# Patient Record
Sex: Male | Born: 1970 | Race: White | Hispanic: No | Marital: Married | Smoking: Never smoker
Health system: Southern US, Community
[De-identification: ages and names within clinical notes are randomized; demographics above are authoritative.]

## PROBLEM LIST (undated history)

## (undated) DIAGNOSIS — E079 Disorder of thyroid, unspecified: Secondary | ICD-10-CM

## (undated) DIAGNOSIS — F329 Major depressive disorder, single episode, unspecified: Secondary | ICD-10-CM

## (undated) DIAGNOSIS — E236 Other disorders of pituitary gland: Secondary | ICD-10-CM

## (undated) DIAGNOSIS — R4189 Other symptoms and signs involving cognitive functions and awareness: Secondary | ICD-10-CM

## (undated) DIAGNOSIS — R030 Elevated blood-pressure reading, without diagnosis of hypertension: Secondary | ICD-10-CM

## (undated) DIAGNOSIS — I10 Essential (primary) hypertension: Secondary | ICD-10-CM

## (undated) DIAGNOSIS — J45909 Unspecified asthma, uncomplicated: Secondary | ICD-10-CM

## (undated) DIAGNOSIS — G4733 Obstructive sleep apnea (adult) (pediatric): Secondary | ICD-10-CM

## (undated) DIAGNOSIS — K2210 Ulcer of esophagus without bleeding: Secondary | ICD-10-CM

## (undated) DIAGNOSIS — F411 Generalized anxiety disorder: Secondary | ICD-10-CM

## (undated) DIAGNOSIS — M6281 Muscle weakness (generalized): Secondary | ICD-10-CM

## (undated) DIAGNOSIS — I309 Acute pericarditis, unspecified: Secondary | ICD-10-CM

## (undated) DIAGNOSIS — H9319 Tinnitus, unspecified ear: Secondary | ICD-10-CM

## (undated) HISTORY — DX: Muscle weakness (generalized): M62.81

## (undated) HISTORY — PX: URETEROSCOPY: SHX842

## (undated) HISTORY — PX: COLONOSCOPY: SHX174

## (undated) HISTORY — DX: Other symptoms and signs involving cognitive functions and awareness: R41.89

## (undated) HISTORY — DX: Tinnitus, unspecified ear: H93.19

## (undated) HISTORY — DX: Disorder of thyroid, unspecified: E07.9

## (undated) HISTORY — DX: Acute pericarditis, unspecified: I30.9

## (undated) HISTORY — DX: Generalized anxiety disorder: F41.1

## (undated) HISTORY — DX: Unspecified asthma, uncomplicated: J45.909

## (undated) HISTORY — DX: Obstructive sleep apnea (adult) (pediatric): G47.33

## (undated) HISTORY — PX: NO PAST SURGERIES: SHX2092

## (undated) HISTORY — DX: Major depressive disorder, single episode, unspecified: F32.9

## (undated) HISTORY — DX: Essential (primary) hypertension: I10

---

## 2003-07-10 ENCOUNTER — Emergency Department (HOSPITAL_COMMUNITY): Admission: EM | Admit: 2003-07-10 | Discharge: 2003-07-10 | Payer: Self-pay | Admitting: Emergency Medicine

## 2009-07-26 ENCOUNTER — Encounter: Admission: RE | Admit: 2009-07-26 | Discharge: 2009-07-26 | Payer: Self-pay | Admitting: Family Medicine

## 2013-09-12 ENCOUNTER — Ambulatory Visit (INDEPENDENT_AMBULATORY_CARE_PROVIDER_SITE_OTHER): Payer: 59 | Admitting: Emergency Medicine

## 2013-09-12 VITALS — BP 140/100 | HR 90 | Temp 98.7°F | Resp 18 | Ht 69.8 in | Wt 198.8 lb

## 2013-09-12 DIAGNOSIS — J111 Influenza due to unidentified influenza virus with other respiratory manifestations: Secondary | ICD-10-CM

## 2013-09-12 MED ORDER — OSELTAMIVIR PHOSPHATE 75 MG PO CAPS
75.0000 mg | ORAL_CAPSULE | Freq: Two times a day (BID) | ORAL | Status: DC
Start: 2013-09-12 — End: 2015-04-22

## 2013-09-12 MED ORDER — PSEUDOEPHEDRINE-GUAIFENESIN ER 60-600 MG PO TB12: 1.0000 | ORAL_TABLET | Freq: Two times a day (BID) | ORAL | Status: AC

## 2013-09-12 MED ORDER — PROMETHAZINE-CODEINE 6.25-10 MG/5ML PO SYRP: 5.0000 mL | ORAL_SOLUTION | Freq: Four times a day (QID) | ORAL | Status: DC | PRN

## 2013-09-12 NOTE — Patient Instructions (Signed)

## 2013-09-12 NOTE — Progress Notes (Signed)
[facility] [address], [place] [id number] [phone]  Date:  09/12/2013   Name:  Michael Warren   DOB:  [birth year]   MRN:  [id number]  PCP:  No primary provider on file.    Chief Complaint: scratchy, Headache, Shortness of Breath, chest congestion and Generalized Body Aches   History of Present Illness:  Michael Warren is a 43 y.o. very pleasant male patient who presents with the following:  Ill since yesterday with malaise, fatigue and myalgias.  Has nasal congestion and drainage.  Cough that is non productive.  No fever or chills.  No nausea or vomiting.   No stool change or rash.  No improvement with over the counter medications or other home remedies. Denies other complaint or health concern today. Has several ill co workers.  No flu shot.  There are no active problems to display for this patient.   Past Medical History  Diagnosis Date  . Thyroid disease     History reviewed. No pertinent past surgical history.  History  Substance Use Topics  . Smoking status: Never Smoker   . Smokeless tobacco: Not on file  . Alcohol Use: No    History reviewed. No pertinent family history.  No Known Allergies  Medication list has been reviewed and updated.  No current outpatient prescriptions on file prior to visit.   No current facility-administered medications on file prior to visit.    Review of Systems:  As per HPI, otherwise negative.    Physical Examination: Filed Vitals:   09/12/13 1326  BP: 140/100  Pulse: 90  Temp: 98.7 F (37.1 C)  Resp: 18   Filed Vitals:   09/12/13 1326  Height: 5' 9.8" (1.773 m)  Weight: 198 lb 12.8 oz (90.175 kg)   Body mass index is 28.69 kg/(m^2). Ideal Body Weight: Weight in (lb) to have BMI = 25: 172.9  GEN: WDWN, NAD, Non-toxic, A & O x 3 HEENT: Atraumatic, Normocephalic. Neck supple. No masses, No LAD. Ears and Nose: No external deformity. CV: RRR, No M/G/R. No JVD. No thrill. No extra heart  sounds. PULM: CTA B, no wheezes, crackles, rhonchi. No retractions. No resp. distress. No accessory muscle use. ABD: S, NT, ND, +BS. No rebound. No HSM. EXTR: No c/c/e NEURO Normal gait.  PSYCH: Normally interactive. Conversant. Not depressed or anxious appearing.  Calm demeanor.    Assessment and Plan: Influenza tamiflu mucinex d Phen c cod  Signed,  Phillips OdorJeffery Spencer Cardinal, MD

## 2014-01-28 ENCOUNTER — Other Ambulatory Visit: Payer: Self-pay | Admitting: Internal Medicine

## 2014-01-28 DIAGNOSIS — E236 Other disorders of pituitary gland: Secondary | ICD-10-CM

## 2014-01-28 DIAGNOSIS — Z808 Family history of malignant neoplasm of other organs or systems: Secondary | ICD-10-CM

## 2014-02-06 ENCOUNTER — Ambulatory Visit
Admission: RE | Admit: 2014-02-06 | Discharge: 2014-02-06 | Disposition: A | Discharge status: Home / Self Care | Payer: 59 | Source: Ambulatory Visit | Attending: Internal Medicine | Admitting: Internal Medicine

## 2014-02-06 DIAGNOSIS — E236 Other disorders of pituitary gland: Secondary | ICD-10-CM

## 2014-02-06 DIAGNOSIS — Z808 Family history of malignant neoplasm of other organs or systems: Secondary | ICD-10-CM

## 2014-02-06 MED ORDER — GADOBENATE DIMEGLUMINE 529 MG/ML IV SOLN
13.0000 mL | Freq: Once | INTRAVENOUS | Status: AC | PRN
  Administered 2014-02-06: 13 mL via INTRAVENOUS

## 2015-04-22 ENCOUNTER — Encounter (HOSPITAL_COMMUNITY): Payer: Self-pay | Admitting: Emergency Medicine

## 2015-04-22 ENCOUNTER — Emergency Department (HOSPITAL_COMMUNITY)
Admission: EM | Admit: 2015-04-22 | Discharge: 2015-04-22 | Disposition: A | Discharge status: Home / Self Care | Payer: 59 | Attending: Emergency Medicine | Admitting: Emergency Medicine

## 2015-04-22 DIAGNOSIS — Z79899 Other long term (current) drug therapy: Secondary | ICD-10-CM | POA: Insufficient documentation

## 2015-04-22 DIAGNOSIS — Z7982 Long term (current) use of aspirin: Secondary | ICD-10-CM | POA: Diagnosis not present

## 2015-04-22 DIAGNOSIS — K219 Gastro-esophageal reflux disease without esophagitis: Secondary | ICD-10-CM | POA: Diagnosis not present

## 2015-04-22 DIAGNOSIS — E079 Disorder of thyroid, unspecified: Secondary | ICD-10-CM | POA: Diagnosis not present

## 2015-04-22 DIAGNOSIS — R1310 Dysphagia, unspecified: Secondary | ICD-10-CM | POA: Diagnosis present

## 2015-04-22 DIAGNOSIS — K2970 Gastritis, unspecified, without bleeding: Secondary | ICD-10-CM | POA: Diagnosis not present

## 2015-04-22 LAB — COMPREHENSIVE METABOLIC PANEL
ALT: 18 U/L (ref 17–63)
AST: 20 U/L (ref 15–41)
Albumin: 4.2 g/dL (ref 3.5–5.0)
Alkaline Phosphatase: 57 U/L (ref 38–126)
Anion gap: 9 (ref 5–15)
BUN: 19 mg/dL (ref 6–20)
CO2: 27 mmol/L (ref 22–32)
Calcium: 9 mg/dL (ref 8.9–10.3)
Chloride: 104 mmol/L (ref 101–111)
Creatinine, Ser: 1.17 mg/dL (ref 0.61–1.24)
GFR calc Af Amer: >60 mL/min (ref >60)
GFR calc non Af Amer: >60 mL/min (ref >60)
Glucose, Bld: 88 mg/dL (ref 65–99)
Potassium: 3.8 mmol/L (ref 3.5–5.1)
Sodium: 140 mmol/L (ref 135–145)
Total Bilirubin: 0.9 mg/dL (ref 0.3–1.2)
Total Protein: 7.2 g/dL (ref 6.5–8.1)

## 2015-04-22 LAB — CBC
HCT: 49 % (ref 39.0–52.0)
Hemoglobin: 16.9 g/dL (ref 13.0–17.0)
MCH: 30.7 pg (ref 26.0–34.0)
MCHC: 34.5 g/dL (ref 30.0–36.0)
MCV: 89.1 fL (ref 78.0–100.0)
Platelets: 197 10*3/uL (ref 150–400)
RBC: 5.5 MIL/uL (ref 4.22–5.81)
RDW: 11.9 % (ref 11.5–15.5)
WBC: 8.7 10*3/uL (ref 4.0–10.5)

## 2015-04-22 LAB — LIPASE, BLOOD: Lipase: 27 U/L (ref 22–51)

## 2015-04-22 LAB — I-STAT TROPONIN, ED: Troponin i, poc: 0.01 ng/mL (ref 0.00–0.08)

## 2015-04-22 MED ORDER — PANTOPRAZOLE SODIUM 40 MG PO TBEC: 40.0000 mg | DELAYED_RELEASE_TABLET | Freq: Every day | ORAL | Status: DC

## 2015-04-22 MED ORDER — GI COCKTAIL ~~LOC~~
30.0000 mL | Freq: Once | ORAL | Status: AC
  Administered 2015-04-22: 30 mL via ORAL
  Filled 2015-04-22: qty 30

## 2015-04-22 MED ORDER — ACETAMINOPHEN 500 MG PO TABS
1000.0000 mg | ORAL_TABLET | Freq: Once | ORAL | Status: AC
  Administered 2015-04-22: 1000 mg via ORAL
  Filled 2015-04-22: qty 2

## 2015-04-22 MED ORDER — SUCRALFATE 1 G PO TABS: 1.0000 g | ORAL_TABLET | Freq: Three times a day (TID) | ORAL | Status: DC

## 2015-04-22 MED ORDER — PANTOPRAZOLE SODIUM 40 MG PO TBEC
40.0000 mg | DELAYED_RELEASE_TABLET | Freq: Once | ORAL | Status: AC
  Administered 2015-04-22: 40 mg via ORAL
  Filled 2015-04-22: qty 1

## 2015-04-22 NOTE — ED Provider Notes (Signed)
CSN: [id number]     Arrival date & time 04/22/15  1349 History   First MD Initiated Contact with Patient 04/22/15 1543     Chief Complaint  Patient presents with  . Dysphagia    esophagus pain with swallowing     (Consider location/radiation/quality/duration/timing/severity/associated sxs/prior Treatment) HPI   Patient is a 44 year old male with history of thyroid disease, who presents to the emergency room with approximately 1 week of epigastric and central chest pain associated with painful swallowing of liquids and solids. He states he has a history of GERD and was recently instructed to increased his Prilosec to 40 mg twice a day, this has not improved his pain. He states that he has severe pain with eating, described similar to "swallowing a fish bone", and he has a lingering pain in his epigastric and central chest area that is described as achy, rated 2 out of 10.  He states he physically can swallow both liquids and solids without any sensation of his food being stuck and without vomiting. He states he has had postprandial fullness for several weeks and generalized bloating. He takes either ibuprofen or aspirin several times a week for headaches and drinks a beer most days of the week.  He denies hematemesis, CP, lightheadedness, palpitations, LE edema, SOB, fever, chills, sweats, diarrhea, constipation, melena, hematochezia.  He does have an upcoming appointment with GI for an initial evaluation.  Past Medical History  Diagnosis Date  . Thyroid disease    History reviewed. No pertinent past surgical history. History reviewed. No pertinent family history. Social History  Substance Use Topics  . Smoking status: Never Smoker   . Smokeless tobacco: None  . Alcohol Use: No    Review of Systems  Constitutional: Negative.   HENT: Negative.   Eyes: Negative.   Respiratory: Negative.  Negative for cough, choking, shortness of breath, wheezing and stridor.   Cardiovascular: Negative.    Gastrointestinal: Positive for abdominal pain and abdominal distention. Negative for nausea, vomiting, diarrhea, constipation, blood in stool and rectal pain.  Endocrine: Negative.   Genitourinary: Negative.   Musculoskeletal: Negative.   Skin: Negative.   Neurological: Negative.   Hematological: Negative.   Psychiatric/Behavioral: Negative.       Allergies  Review of patient's allergies indicates no known allergies.  Home Medications   Prior to Admission medications   Medication Sig Start Date End Date Taking? Authorizing Provider  aspirin 325 MG EC tablet Take 650 mg by mouth 2 (two) times daily as needed for pain.   Yes Historical Provider, MD  bismuth subsalicylate (PEPTO BISMOL) 262 MG/15ML suspension Take 30 mLs by mouth 2 (two) times daily as needed for indigestion.   Yes Historical Provider, MD  calcium carbonate (TUMS - DOSED IN MG ELEMENTAL CALCIUM) 500 MG chewable tablet Chew 2 tablets by mouth 2 (two) times daily as needed for indigestion or heartburn.   Yes Historical Provider, MD  cetirizine (ZYRTEC) 10 MG tablet Take 10 mg by mouth daily as needed for allergies.   Yes Historical Provider, MD  Cholecalciferol (VITAMIN D3) 5000 UNITS CAPS Take 5,000 Units by mouth daily.   Yes Historical Provider, MD  ibuprofen (ADVIL,MOTRIN) 200 MG tablet Take 400 mg by mouth 2 (two) times daily as needed for moderate pain.   Yes Historical Provider, MD  L-ARGININE PO Take 1 capsule by mouth daily.   Yes Historical Provider, MD  levothyroxine (SYNTHROID, LEVOTHROID) 125 MCG tablet Take 125 mcg by mouth daily.   Yes Historical  Provider, MD  liothyronine (CYTOMEL) 5 MCG tablet Take 5 mcg by mouth daily. 02/24/15  Yes Historical Provider, MD  MAGNESIUM PO Take 1 tablet by mouth daily.   Yes Historical Provider, MD  Multiple Vitamin (MULTIVITAMIN WITH MINERALS) TABS tablet Take 1 tablet by mouth daily.   Yes Historical Provider, MD  Nutritional Supplements (DHEA PO) Take 1 tablet by mouth  daily.   Yes Historical Provider, MD  oxymetazoline (AFRIN) 0.05 % nasal spray Place 1 spray into both nostrils daily as needed for congestion.   Yes Historical Provider, MD  Selenium (SELENIMIN PO) Take 1 tablet by mouth daily.   Yes Historical Provider, MD  testosterone cypionate (DEPOTESTOTERONE CYPIONATE) 200 MG/ML injection Inject 200 mg into the muscle every 14 (fourteen) days.   Yes Historical Provider, MD  VENTOLIN HFA 108 (90 BASE) MCG/ACT inhaler Inhale 2 puffs into the lungs daily as needed for wheezing or shortness of breath.  03/16/15  Yes Historical Provider, MD  pantoprazole (PROTONIX) 40 MG tablet Take 1 tablet (40 mg total) by mouth daily. 04/22/15   Danelle Berry, PA-C  sucralfate (CARAFATE) 1 G tablet Take 1 tablet (1 g total) by mouth 4 (four) times daily -  with meals and at bedtime. 04/22/15   Fermin Yan, PA-C   BP 150/101 mmHg  Pulse 84  Temp(Src) 98.3 F (36.8 C) (Oral)  Resp 14  SpO2 98% Physical Exam  Constitutional: He is oriented to person, place, and time. He appears well-developed and well-nourished. No distress.  HENT:  Head: Normocephalic and atraumatic.  Nose: Nose normal.  Mouth/Throat: Oropharynx is clear and moist. No oropharyngeal exudate.  Eyes: Conjunctivae and EOM are normal. Pupils are equal, round, and reactive to light. Right eye exhibits no discharge. Left eye exhibits no discharge. No scleral icterus.  Neck: Normal range of motion. No JVD present. No tracheal deviation present. No thyromegaly present.  Cardiovascular: Normal rate, regular rhythm, normal heart sounds and intact distal pulses.  Exam reveals no gallop and no friction rub.   No murmur heard. Pulmonary/Chest: Effort normal and breath sounds normal. No respiratory distress. He has no wheezes. He has no rales. He exhibits no tenderness.  Abdominal: Soft. Bowel sounds are normal. He exhibits no distension and no mass. There is tenderness. There is no rebound and no guarding.  Epigastric  ttp,no guarding  Musculoskeletal: Normal range of motion. He exhibits no edema or tenderness.  Lymphadenopathy:    He has no cervical adenopathy.  Neurological: He is alert and oriented to person, place, and time. He has normal reflexes. No cranial nerve deficit. He exhibits normal muscle tone. Coordination normal.  Skin: Skin is warm and dry. No rash noted. He is not diaphoretic. No erythema. No pallor.  Psychiatric: He has a normal mood and affect. His behavior is normal. Judgment and thought content normal.  Nursing note and vitals reviewed.   ED Course  Procedures (including critical care time) Labs Review Labs Reviewed  LIPASE, BLOOD  COMPREHENSIVE METABOLIC PANEL  CBC  I-STAT TROPOININ, ED    Imaging Review No results found. I have personally reviewed and evaluated these images and lab results as part of my medical decision-making.   EKG Interpretation   Date/Time:  Thursday April 22 2015 14:18:42 EDT Ventricular Rate:  103 PR Interval:  155 QRS Duration: 90 QT Interval:  307 QTC Calculation: 402 R Axis:   48 Text Interpretation:  Sinus tachycardia Aberrant conduction of SV  complex(es) Probable left atrial enlargement Baseline wander in  lead(s) I  aVL ED PHYSICIAN INTERPRETATION AVAILABLE IN [facility] Confirmed by  TEST, Record ([id number]) on 04/23/2015 6:44:42 AM      MDM   Final diagnoses:  Gastritis   Pt with epigastric and central chest pain associated with eating, no difficulty swallowing solids or liquids, but states it is painful.  He has had rapid increase in pain over the last week resulting in decreased PO intake.    Pt was given GI cocktail and pantoprazole, reports significant improvement of pain, "almost 0" and is requesting to eat.    Pt has had elevated BP while in ED.  He developed a HA while here.  He has a history of frequent HA's, states he has a dx of migraines.  His HA is located over his right eye, he claims it may have begun since he  frequently has headaches when he does not eat, last ate at 8am.  Pt was hoping to D/C home with improvement of his pain so he could eat.   EKG and troponin were checked and were negative.  He has GI follow up this week.  D/C home with protonix to try instread of omeprazole.  GERD and PUD testing and treatments reviewed, pain may likely be from gastritis and/or esophagitis.  No concern for dysphasia, or cardiac etiology of pain.  Pt will f/up on BP with his PCP.      Danelle Berry, PA-C 04/30/15 0346  Laurence Spates, MD 05/01/15 2152

## 2015-04-22 NOTE — ED Notes (Signed)
Pt reports he saw his PCP on Monday for esophagus pain since Saturday. Pain worse with eating, drinking or swallowing. Pain in sternal area. Pt is scheduled to have an endoscopy with an appointment with specialist [place]. No SOB or lightheadedness.

## 2015-04-22 NOTE — Discharge Instructions (Signed)
Stop taking omeprazole and start taking protonix once a day.  Do not take aspirin or NSAIDs (nonsteroidal anti-inflammatories, such as ibuprofen, Aleve, naproxen), and refrain from drinking alcohol.  Please keep your appointment with GI for further evaluation and treatment. Gastritis, Adult Gastritis is soreness and swelling (inflammation) of the lining of the stomach. Gastritis can develop as a sudden onset (acute) or long-term (chronic) condition. If gastritis is not treated, it can lead to stomach bleeding and ulcers. CAUSES  Gastritis occurs when the stomach lining is weak or damaged. Digestive juices from the stomach then inflame the weakened stomach lining. The stomach lining may be weak or damaged due to viral or bacterial infections. One common bacterial infection is the Helicobacter pylori infection. Gastritis can also result from excessive alcohol consumption, taking certain medicines, or having too much acid in the stomach.  SYMPTOMS  In some cases, there are no symptoms. When symptoms are present, they may include:  Pain or a burning sensation in the upper abdomen.  Nausea.  Vomiting.  An uncomfortable feeling of fullness after eating. DIAGNOSIS  Your caregiver may suspect you have gastritis based on your symptoms and a physical exam. To determine the cause of your gastritis, your caregiver may perform the following:  Blood or stool tests to check for the H pylori bacterium.  Gastroscopy. A thin, flexible tube (endoscope) is passed down the esophagus and into the stomach. The endoscope has a light and camera on the end. Your caregiver uses the endoscope to view the inside of the stomach.  Taking a tissue sample (biopsy) from the stomach to examine under a microscope. TREATMENT  Depending on the cause of your gastritis, medicines may be prescribed. If you have a bacterial infection, such as an H pylori infection, antibiotics may be given. If your gastritis is caused by too much  acid in the stomach, H2 blockers or antacids may be given. Your caregiver may recommend that you stop taking aspirin, ibuprofen, or other nonsteroidal anti-inflammatory drugs (NSAIDs). HOME CARE INSTRUCTIONS  Only take over-the-counter or prescription medicines as directed by your caregiver.  If you were given antibiotic medicines, take them as directed. Finish them even if you start to feel better.  Drink enough fluids to keep your urine clear or pale yellow.  Avoid foods and drinks that make your symptoms worse, such as:  Caffeine or alcoholic drinks.  Chocolate.  Peppermint or mint flavorings.  Garlic and onions.  Spicy foods.  Citrus fruits, such as oranges, lemons, or limes.  Tomato-based foods such as sauce, chili, salsa, and pizza.  Fried and fatty foods.  Eat small, frequent meals instead of large meals. SEEK IMMEDIATE MEDICAL CARE IF:   You have black or dark red stools.  You vomit blood or material that looks like coffee grounds.  You are unable to keep fluids down.  Your abdominal pain gets worse.  You have a fever.  You do not feel better after 1 week.  You have any other questions or concerns. MAKE SURE YOU:  Understand these instructions.  Will watch your condition.  Will get help right away if you are not doing well or get worse.   This information is not intended to replace advice given to you by your health care provider. Make sure you discuss any questions you have with your health care provider.   Document Released: 06/20/2001 Document Revised: 12/26/2011 Document Reviewed: 08/09/2011 Elsevier Interactive Patient Education 2016 [employer]  Gastroesophageal Reflux Disease, Adult Normally, food travels down  the esophagus and stays in the stomach to be digested. However, when a person has gastroesophageal reflux disease (GERD), food and stomach acid move back up into the esophagus. When this happens, the esophagus becomes sore and inflamed.  Over time, GERD can create small holes (ulcers) in the lining of the esophagus.  CAUSES This condition is caused by a problem with the muscle between the esophagus and the stomach (lower esophageal sphincter, or LES). Normally, the LES muscle closes after food passes through the esophagus to the stomach. When the LES is weakened or abnormal, it does not close properly, and that allows food and stomach acid to go back up into the esophagus. The LES can be weakened by certain dietary substances, medicines, and medical conditions, including:  Tobacco use.  Pregnancy.  Having a hiatal hernia.  Heavy alcohol use.  Certain foods and beverages, such as coffee, chocolate, onions, and peppermint. RISK FACTORS This condition is more likely to develop in:  People who have an increased body weight.  People who have connective tissue disorders.  People who use NSAID medicines. SYMPTOMS Symptoms of this condition include:  Heartburn.  Difficult or painful swallowing.  The feeling of having a lump in the throat.  Abitter taste in the mouth.  Bad breath.  Having a large amount of saliva.  Having an upset or bloated stomach.  Belching.  Chest pain.  Shortness of breath or wheezing.  Ongoing (chronic) cough or a night-time cough.  Wearing away of tooth enamel.  Weight loss. Different conditions can cause chest pain. Make sure to see your health care provider if you experience chest pain. DIAGNOSIS Your health care provider will take a medical history and perform a physical exam. To determine if you have mild or severe GERD, your health care provider may also monitor how you respond to treatment. You may also have other tests, including:  An endoscopy toexamine your stomach and esophagus with a small camera.  A test thatmeasures the acidity level in your esophagus.  A test thatmeasures how much pressure is on your esophagus.  A barium swallow or modified barium swallow to  show the shape, size, and functioning of your esophagus. TREATMENT The goal of treatment is to help relieve your symptoms and to prevent complications. Treatment for this condition may vary depending on how severe your symptoms are. Your health care provider may recommend:  Changes to your diet.  Medicine.  Surgery. HOME CARE INSTRUCTIONS Diet  Follow a diet as recommended by your health care provider. This may involve avoiding foods and drinks such as:  Coffee and tea (with or without caffeine).  Drinks that containalcohol.  Energy drinks and sports drinks.  Carbonated drinks or sodas.  Chocolate and cocoa.  Peppermint and mint flavorings.  Garlic and onions.  Horseradish.  Spicy and acidic foods, including peppers, chili powder, curry powder, vinegar, hot sauces, and barbecue sauce.  Citrus fruit juices and citrus fruits, such as oranges, lemons, and limes.  Tomato-based foods, such as red sauce, chili, salsa, and pizza with red sauce.  Fried and fatty foods, such as donuts, french fries, potato chips, and high-fat dressings.  High-fat meats, such as hot dogs and fatty cuts of red and white meats, such as rib eye steak, sausage, ham, and bacon.  High-fat dairy items, such as whole milk, butter, and cream cheese.  Eat small, frequent meals instead of large meals.  Avoid drinking large amounts of liquid with your meals.  Avoid eating meals during the  2-3 hours before bedtime.  Avoid lying down right after you eat.  Do not exercise right after you eat. General Instructions  Pay attention to any changes in your symptoms.  Take over-the-counter and prescription medicines only as told by your health care provider. Do not take aspirin, ibuprofen, or other NSAIDs unless your health care provider told you to do so.  Do not use any tobacco products, including cigarettes, chewing tobacco, and e-cigarettes. If you need help quitting, ask your health care  provider.  Wear loose-fitting clothing. Do not wear anything tight around your waist that causes pressure on your abdomen.  Raise (elevate) the head of your bed 6 inches (15cm).  Try to reduce your stress, such as with yoga or meditation. If you need help reducing stress, ask your health care provider.  If you are overweight, reduce your weight to an amount that is healthy for you. Ask your health care provider for guidance about a safe weight loss goal.  Keep all follow-up visits as told by your health care provider. This is important. SEEK MEDICAL CARE IF:  You have new symptoms.  You have unexplained weight loss.  You have difficulty swallowing, or it hurts to swallow.  You have wheezing or a persistent cough.  Your symptoms do not improve with treatment.  You have a hoarse voice. SEEK IMMEDIATE MEDICAL CARE IF:  You have pain in your arms, neck, jaw, teeth, or back.  You feel sweaty, dizzy, or light-headed.  You have chest pain or shortness of breath.  You vomit and your vomit looks like blood or coffee grounds.  You faint.  Your stool is bloody or black.  You cannot swallow, drink, or eat.   This information is not intended to replace advice given to you by your health care provider. Make sure you discuss any questions you have with your health care provider.   Document Released: 04/05/2005 Document Revised: 03/17/2015 Document Reviewed: 10/21/2014 Elsevier Interactive Patient Education [employer]

## 2015-04-22 NOTE — ED Notes (Signed)
Pt states "this has been going on since Saturday night.  They were supposed to call me for an endoscopy but they never got me.  Endo finally got me today and said they could work me in tomorrow.  They told me to double up on my omeprazole; the pain does not ever go away.  I told them I had to work this weekend."

## 2015-12-22 ENCOUNTER — Emergency Department (HOSPITAL_COMMUNITY): Payer: 59

## 2015-12-22 ENCOUNTER — Encounter (HOSPITAL_COMMUNITY): Payer: Self-pay | Admitting: Emergency Medicine

## 2015-12-22 ENCOUNTER — Observation Stay (HOSPITAL_COMMUNITY)
Admission: EM | Admit: 2015-12-22 | Discharge: 2015-12-23 | Disposition: A | Discharge status: Home / Self Care | Payer: 59 | Attending: Internal Medicine | Admitting: Internal Medicine

## 2015-12-22 DIAGNOSIS — R079 Chest pain, unspecified: Secondary | ICD-10-CM | POA: Diagnosis present

## 2015-12-22 DIAGNOSIS — E236 Other disorders of pituitary gland: Secondary | ICD-10-CM | POA: Diagnosis present

## 2015-12-22 DIAGNOSIS — Z79890 Hormone replacement therapy (postmenopausal): Secondary | ICD-10-CM | POA: Diagnosis not present

## 2015-12-22 DIAGNOSIS — Z79899 Other long term (current) drug therapy: Secondary | ICD-10-CM | POA: Diagnosis not present

## 2015-12-22 DIAGNOSIS — F458 Other somatoform disorders: Secondary | ICD-10-CM | POA: Diagnosis not present

## 2015-12-22 DIAGNOSIS — I10 Essential (primary) hypertension: Secondary | ICD-10-CM | POA: Diagnosis present

## 2015-12-22 DIAGNOSIS — Z7982 Long term (current) use of aspirin: Secondary | ICD-10-CM | POA: Insufficient documentation

## 2015-12-22 DIAGNOSIS — I309 Acute pericarditis, unspecified: Principal | ICD-10-CM | POA: Insufficient documentation

## 2015-12-22 DIAGNOSIS — I493 Ventricular premature depolarization: Secondary | ICD-10-CM | POA: Diagnosis not present

## 2015-12-22 DIAGNOSIS — E039 Hypothyroidism, unspecified: Secondary | ICD-10-CM | POA: Insufficient documentation

## 2015-12-22 DIAGNOSIS — R0989 Other specified symptoms and signs involving the circulatory and respiratory systems: Secondary | ICD-10-CM | POA: Diagnosis present

## 2015-12-22 HISTORY — DX: Ulcer of esophagus without bleeding: K22.10

## 2015-12-22 HISTORY — DX: Elevated blood-pressure reading, without diagnosis of hypertension: R03.0

## 2015-12-22 HISTORY — DX: Other disorders of pituitary gland: E23.6

## 2015-12-22 LAB — BASIC METABOLIC PANEL
ANION GAP: 10 (ref 5–15)
BUN: 11 mg/dL (ref 6–20)
CALCIUM: 9.6 mg/dL (ref 8.9–10.3)
CO2: 28 mmol/L (ref 22–32)
Chloride: 101 mmol/L (ref 101–111)
Creatinine, Ser: 1.23 mg/dL (ref 0.61–1.24)
GLUCOSE: 98 mg/dL (ref 65–99)
POTASSIUM: 3.9 mmol/L (ref 3.5–5.1)
Sodium: 139 mmol/L (ref 135–145)

## 2015-12-22 LAB — CBC
HEMATOCRIT: 50.8 % (ref 39.0–52.0)
HEMOGLOBIN: 17.7 g/dL — AB (ref 13.0–17.0)
MCH: 30.6 pg (ref 26.0–34.0)
MCHC: 34.8 g/dL (ref 30.0–36.0)
MCV: 87.9 fL (ref 78.0–100.0)
Platelets: 194 10*3/uL (ref 150–400)
RBC: 5.78 MIL/uL (ref 4.22–5.81)
RDW: 12.2 % (ref 11.5–15.5)
WBC: 16.3 10*3/uL — ABNORMAL HIGH (ref 4.0–10.5)

## 2015-12-22 LAB — HEPATIC FUNCTION PANEL
ALBUMIN: 4.4 g/dL (ref 3.5–5.0)
ALT: 21 U/L (ref 17–63)
AST: 23 U/L (ref 15–41)
Alkaline Phosphatase: 57 U/L (ref 38–126)
Bilirubin, Direct: <0.1 mg/dL — ABNORMAL LOW (ref 0.1–0.5)
TOTAL PROTEIN: 7.2 g/dL (ref 6.5–8.1)
Total Bilirubin: 0.8 mg/dL (ref 0.3–1.2)

## 2015-12-22 LAB — URINALYSIS, ROUTINE W REFLEX MICROSCOPIC
BILIRUBIN URINE: NEGATIVE
Glucose, UA: NEGATIVE mg/dL
Hgb urine dipstick: NEGATIVE
KETONES UR: 15 mg/dL — AB
Leukocytes, UA: NEGATIVE
NITRITE: NEGATIVE
PH: 6.5 (ref 5.0–8.0)
PROTEIN: NEGATIVE mg/dL
Specific Gravity, Urine: 1.013 (ref 1.005–1.030)

## 2015-12-22 LAB — I-STAT TROPONIN, ED
TROPONIN I, POC: 0 ng/mL (ref 0.00–0.08)
Troponin i, poc: 0 ng/mL (ref 0.00–0.08)

## 2015-12-22 LAB — TROPONIN I

## 2015-12-22 LAB — D-DIMER, QUANTITATIVE: D-Dimer, Quant: <0.27 ug/mL-FEU (ref 0.00–0.50)

## 2015-12-22 MED ORDER — NITROGLYCERIN 0.4 MG SL SUBL: 0.4000 mg | SUBLINGUAL_TABLET | SUBLINGUAL | Status: DC | PRN

## 2015-12-22 MED ORDER — PANTOPRAZOLE SODIUM 40 MG PO TBEC
80.0000 mg | DELAYED_RELEASE_TABLET | Freq: Every day | ORAL | Status: DC
Start: 2015-12-23 — End: 2015-12-23
  Administered 2015-12-23: 80 mg via ORAL
  Filled 2015-12-22: qty 2

## 2015-12-22 MED ORDER — ONDANSETRON HCL 4 MG/2ML IJ SOLN: 4.0000 mg | Freq: Four times a day (QID) | INTRAMUSCULAR | Status: DC | PRN

## 2015-12-22 MED ORDER — ALBUTEROL SULFATE (2.5 MG/3ML) 0.083% IN NEBU
2.5000 mg | INHALATION_SOLUTION | Freq: Every day | RESPIRATORY_TRACT | Status: DC | PRN
Start: 2015-12-22 — End: 2015-12-23

## 2015-12-22 MED ORDER — GI COCKTAIL ~~LOC~~
30.0000 mL | Freq: Once | ORAL | Status: AC
Start: 2015-12-22 — End: 2015-12-22
  Administered 2015-12-22: 30 mL via ORAL
  Filled 2015-12-22: qty 30

## 2015-12-22 MED ORDER — CALCIUM CARBONATE ANTACID 500 MG PO CHEW: 2.0000 | CHEWABLE_TABLET | Freq: Two times a day (BID) | ORAL | Status: DC | PRN

## 2015-12-22 MED ORDER — LORATADINE 10 MG PO TABS
10.0000 mg | ORAL_TABLET | Freq: Every day | ORAL | Status: DC
  Administered 2015-12-23: 10 mg via ORAL
  Filled 2015-12-22: qty 1

## 2015-12-22 MED ORDER — SODIUM CHLORIDE 0.9 % IV BOLUS (SEPSIS)
500.0000 mL | Freq: Once | INTRAVENOUS | Status: AC
  Administered 2015-12-22: 500 mL via INTRAVENOUS

## 2015-12-22 MED ORDER — BISMUTH SUBSALICYLATE 262 MG/15ML PO SUSP: 30.0000 mL | Freq: Two times a day (BID) | ORAL | Status: DC | PRN

## 2015-12-22 MED ORDER — LEVOTHYROXINE SODIUM 125 MCG PO TABS: 125.0000 ug | ORAL_TABLET | Freq: Every day | ORAL | Status: DC

## 2015-12-22 MED ORDER — MORPHINE SULFATE (PF) 2 MG/ML IV SOLN: 2.0000 mg | INTRAVENOUS | Status: DC | PRN

## 2015-12-22 MED ORDER — ACETAMINOPHEN 325 MG PO TABS
650.0000 mg | ORAL_TABLET | ORAL | Status: DC | PRN
  Administered 2015-12-23: 650 mg via ORAL
  Filled 2015-12-22: qty 2

## 2015-12-22 MED ORDER — LIOTHYRONINE SODIUM 5 MCG PO TABS
5.0000 ug | ORAL_TABLET | Freq: Every day | ORAL | Status: DC
  Administered 2015-12-23: 5 ug via ORAL
  Filled 2015-12-22: qty 1

## 2015-12-22 MED ORDER — ENOXAPARIN SODIUM 40 MG/0.4ML ~~LOC~~ SOLN
40.0000 mg | Freq: Every day | SUBCUTANEOUS | Status: DC
  Administered 2015-12-23: 40 mg via SUBCUTANEOUS
  Filled 2015-12-22: qty 0.4

## 2015-12-22 NOTE — ED Provider Notes (Signed)
CSN: [id number]     Arrival date & time 12/22/15  1458 History   First MD Initiated Contact with Patient 12/22/15 1541     Chief Complaint  Patient presents with  . Chest Pain    HPI Comments: 45 y.o. Male with a history of hypothyroidism, partial empty sella syndrome presents to the ED due to "cramping" that began in the base of his throat and traveled inferiorly starting 2 hours ago. Initially he had no associated nausea, vomiting, diaphoresis. While in the ED, after having his blood drawn he became very diaphoretic, pale, nauseated and had a syncopal episode lasting 20 seconds. He has a history of esophageal ulcers, and reports that this feels nothing like that. He does not smoke, no family history of heart attacks, no peripheral artery disease, no CVA. He denies a history of hypertension, hyperlipidemia, diabetes.  Patient is a 45 y.o. male presenting with chest pain. The history is provided by the patient.  Chest Pain Pain location:  Substernal area Pain quality comment:  Cramping Radiates to: throat. Pain radiates to the back: no   Pain severity:  Severe Onset quality:  Sudden Duration:  2 hours Timing:  Constant Progression:  Worsening Chronicity:  New Context: breathing and at rest   Relieved by:  Nothing Worsened by:  Deep breathing Ineffective treatments: drinking water. Associated symptoms: diaphoresis, nausea, shortness of breath and syncope   Associated symptoms: no abdominal pain and no fever   Syncope:    Duration:  20 seconds   Witnessed: yes (during blood draw in triage)     Suspicion of head trauma:  No Risk factors: hypertension and male sex   Risk factors: no aortic disease, no coronary artery disease, no diabetes mellitus, no high cholesterol, no prior DVT/PE and no smoking     Past Medical History  Diagnosis Date  . Thyroid disease    History reviewed. No pertinent past surgical history. No family history on file. Social History  Substance Use Topics   . Smoking status: Never Smoker   . Smokeless tobacco: None  . Alcohol Use: Yes    Review of Systems  Constitutional: Positive for diaphoresis. Negative for fever.  HENT: Negative for congestion.   Eyes: Negative for redness.  Respiratory: Positive for shortness of breath.   Cardiovascular: Positive for chest pain and syncope. Negative for leg swelling.  Gastrointestinal: Positive for nausea. Negative for abdominal pain.  Genitourinary: Negative for flank pain.  Musculoskeletal: Negative for gait problem.  Skin: Negative for rash.  Neurological: Positive for syncope.  Psychiatric/Behavioral: Negative for confusion.    Allergies  Review of patient's allergies indicates no known allergies.  Home Medications   Prior to Admission medications   Medication Sig Start Date End Date Taking? Authorizing Provider  aspirin 325 MG EC tablet Take 650 mg by mouth 2 (two) times daily as needed for pain.    Historical Provider, MD  bismuth subsalicylate (PEPTO BISMOL) 262 MG/15ML suspension Take 30 mLs by mouth 2 (two) times daily as needed for indigestion.    Historical Provider, MD  calcium carbonate (TUMS - DOSED IN MG ELEMENTAL CALCIUM) 500 MG chewable tablet Chew 2 tablets by mouth 2 (two) times daily as needed for indigestion or heartburn.    Historical Provider, MD  cetirizine (ZYRTEC) 10 MG tablet Take 10 mg by mouth daily as needed for allergies.    Historical Provider, MD  Cholecalciferol (VITAMIN D3) 5000 UNITS CAPS Take 5,000 Units by mouth daily.    Historical  Provider, MD  ibuprofen (ADVIL,MOTRIN) 200 MG tablet Take 400 mg by mouth 2 (two) times daily as needed for moderate pain.    Historical Provider, MD  L-ARGININE PO Take 1 capsule by mouth daily.    Historical Provider, MD  levothyroxine (SYNTHROID, LEVOTHROID) 125 MCG tablet Take 125 mcg by mouth daily.    Historical Provider, MD  liothyronine (CYTOMEL) 5 MCG tablet Take 5 mcg by mouth daily. 02/24/15   Historical Provider, MD   MAGNESIUM PO Take 1 tablet by mouth daily.    Historical Provider, MD  Multiple Vitamin (MULTIVITAMIN WITH MINERALS) TABS tablet Take 1 tablet by mouth daily.    Historical Provider, MD  Nutritional Supplements (DHEA PO) Take 1 tablet by mouth daily.    Historical Provider, MD  oxymetazoline (AFRIN) 0.05 % nasal spray Place 1 spray into both nostrils daily as needed for congestion.    Historical Provider, MD  pantoprazole (PROTONIX) 40 MG tablet Take 1 tablet (40 mg total) by mouth daily. 04/22/15   Danelle Berry, PA-C  Selenium (SELENIMIN PO) Take 1 tablet by mouth daily.    Historical Provider, MD  sucralfate (CARAFATE) 1 G tablet Take 1 tablet (1 g total) by mouth 4 (four) times daily -  with meals and at bedtime. 04/22/15   Danelle Berry, PA-C  testosterone cypionate (DEPOTESTOTERONE CYPIONATE) 200 MG/ML injection Inject 200 mg into the muscle every 14 (fourteen) days.    Historical Provider, MD  VENTOLIN HFA 108 (90 BASE) MCG/ACT inhaler Inhale 2 puffs into the lungs daily as needed for wheezing or shortness of breath.  03/16/15   Historical Provider, MD   BP 123/88 mmHg  Pulse 65  Temp(Src) 98.1 F (36.7 C) (Oral)  Resp 16  Ht 5\' 9"  (1.753 m)  Wt 87.091 kg  BMI 28.34 kg/m2  SpO2 98% Physical Exam  Constitutional: He appears well-developed and well-nourished. No distress.  HENT:  Head: Normocephalic and atraumatic.  Right Ear: External ear normal.  Left Ear: External ear normal.  Mouth/Throat: Oropharynx is clear and moist.  Neck: Normal range of motion.  Cardiovascular: Normal rate and regular rhythm.   Pulses:      Radial pulses are 2+ on the right side, and 2+ on the left side.       Dorsalis pedis pulses are 2+ on the right side, and 2+ on the left side.  No peripheral edema  Pulmonary/Chest: Effort normal and breath sounds normal. No respiratory distress.  Abdominal: Soft. He exhibits no distension. There is no tenderness.  Neurological: He is alert.  Awake, alert, moves all  extremities spontaneously, face symmetric, speech clear and appropriate  Skin: He is diaphoretic (mild, forehead only).  Psychiatric: He has a normal mood and affect. His speech is normal.    ED Course  Procedures  Labs Review Labs Reviewed  CBC - Abnormal; Notable for the following:    WBC 16.3 (*)    Hemoglobin 17.7 (*)    All other components within normal limits  HEPATIC FUNCTION PANEL - Abnormal; Notable for the following:    Bilirubin, Direct <0.1 (*)    All other components within normal limits  URINALYSIS, ROUTINE W REFLEX MICROSCOPIC (NOT AT [facility]) - Abnormal; Notable for the following:    Ketones, ur 15 (*)    All other components within normal limits  BASIC METABOLIC PANEL  D-DIMER, QUANTITATIVE (NOT AT [facility])  TROPONIN I  TROPONIN I  TROPONIN I  I-STAT TROPOININ, ED  Rosezena Sensor, ED  Imaging Review Dg Chest 2 View  12/22/2015  CLINICAL DATA:  Chest pain for 1 day EXAM: CHEST  2 VIEW COMPARISON:  04/21/2015 FINDINGS: Cardiomediastinal silhouette is stable. No infiltrate or pleural effusion. No pulmonary edema. Mild dextroscoliosis mid thoracic spine. IMPRESSION: No active cardiopulmonary disease. Electronically Signed   By: Natasha MeadLiviu  Pop M.D.   On: 12/22/2015 16:46   I have personally reviewed and evaluated these images and lab results as part of my medical decision-making.   EKG Interpretation   Date/Time:  Wednesday December 22 2015 15:11:20 EDT Ventricular Rate:  90 PR Interval:  152 QRS Duration: 86 QT Interval:  322 QTC Calculation: 393 R Axis:   25 Text Interpretation:  Normal sinus rhythm Possible Left atrial enlargement  Borderline ECG No significant change since last tracing Confirmed by  LITTLE MD, RACHEL [phone]([id number]) on 12/22/2015 3:48:22 PM      MDM   Final diagnoses:  Chest pain, unspecified chest pain type   Labs and imaging workup is unremarkable, however the patient continued to be symptomatic with nausea and chest pain. There are components  of his chest pain that seem very typical, and other components seem very atypical. Considered esophageal etiology. He had absolutely no relief with a GI cocktail. He declined nitroglycerin. Serial troponin and EKG are reassuring, however given that his HEAR score is 4, I will admit to medicine for further evaluation and management of his chest pain. Discussed this case with the hospitalist. He was admitted to a telemetry bed.  Case managed in conjunction with my attending, Dr. Clarene DukeLittle.  Maxine GlennAnn Lorenzo Pereyra, MD 12/22/15 2352  Laurence Spatesachel Morgan Little, MD 01/03/16 1311

## 2015-12-22 NOTE — H&P (Addendum)
History and Physical    Michael Denverrennon Eichinger NUU:[id number] DOB: May 21, 1971 DOA: 12/22/2015   PCP: No primary care provider on file. Chief Complaint:  Chief Complaint  Patient presents with  . Chest Pain    HPI: Michael Warren is a 45 y.o. male with medical history significant of HTN, partial empty sella syndrome on hormone replacement, esophageal ulcers (sounds like pill esophagitis) diagnosed by endoscopy late last year.  Patient presents to the ED with c/o central chest and throat pain.  Symptoms onset 2 hours PTA, initially had no associated nausea, vomiting, nor SOB.  Since onset has developed nausea, diaphoresis, and had syncopal episode for 20 seconds while having blood drawn in triage.  This feels like a "knot" in his throat.  ED Course: Trop negative X2, EKG unremarkable, hospitalist asked to admit for CP R/O.  Review of Systems: Negative for melena or blood in stool, does have a hemorrhoid he says.  As per HPI otherwise 10 point review of systems negative.    Past Medical History  Diagnosis Date  . Thyroid disease     History reviewed. No pertinent past surgical history.   reports that he has never smoked. He does not have any smokeless tobacco history on file. He reports that he drinks alcohol. He reports that he does not use illicit drugs.  No Known Allergies  No family history on file.   Prior to Admission medications   Medication Sig Start Date End Date Taking? Authorizing Provider  aspirin 325 MG EC tablet Take 650 mg by mouth 2 (two) times daily as needed for pain (or headaches).    Yes Historical Provider, MD  B-D 3CC LUER-LOK SYR 21GX1-1/2 21G X 1-1/2" 3 ML MISC AS DIRECTED FOR USE WITH TESTOSTERONE IM 11/07/15  Yes Historical Provider, MD  bismuth subsalicylate (PEPTO BISMOL) 262 MG/15ML suspension Take 30 mLs by mouth 2 (two) times daily as needed for indigestion.   Yes Historical Provider, MD  calcium carbonate (TUMS - DOSED IN MG ELEMENTAL CALCIUM) 500 MG chewable  tablet Chew 2 tablets by mouth 2 (two) times daily as needed for indigestion or heartburn.   Yes Historical Provider, MD  cetirizine (ZYRTEC) 10 MG tablet Take 10 mg by mouth daily as needed for allergies.   Yes Historical Provider, MD  Cholecalciferol (VITAMIN D3) 5000 UNITS CAPS Take 5,000 Units by mouth daily.   Yes Historical Provider, MD  ibuprofen (ADVIL,MOTRIN) 200 MG tablet Take 400 mg by mouth 2 (two) times daily as needed for moderate pain.   Yes Historical Provider, MD  L-ARGININE PO Take 1 capsule by mouth daily.   Yes Historical Provider, MD  levothyroxine (SYNTHROID, LEVOTHROID) 125 MCG tablet Take 125 mcg by mouth daily.   Yes Historical Provider, MD  liothyronine (CYTOMEL) 5 MCG tablet Take 5 mcg by mouth daily. 02/24/15  Yes Historical Provider, MD  MAGNESIUM PO Take 1 tablet by mouth daily.   Yes Historical Provider, MD  Multiple Vitamin (MULTIVITAMIN WITH MINERALS) TABS tablet Take 1 tablet by mouth daily.   Yes Historical Provider, MD  Nutritional Supplements (DHEA PO) Take 1 tablet by mouth daily.   Yes Historical Provider, MD  omeprazole (PRILOSEC) 40 MG capsule Take 40 mg by mouth daily. 12/17/15  Yes Historical Provider, MD  oxymetazoline (AFRIN) 0.05 % nasal spray Place 1 spray into both nostrils daily as needed for congestion.   Yes Historical Provider, MD  sildenafil (REVATIO) 20 MG tablet Take 20 mg by mouth daily as needed (as directed).  12/16/15  Yes Historical Provider, MD  testosterone cypionate (DEPOTESTOTERONE CYPIONATE) 200 MG/ML injection Inject 200 mg into the muscle every 14 (fourteen) days.   Yes Historical Provider, MD  VENTOLIN HFA 108 (90 BASE) MCG/ACT inhaler Inhale 2 puffs into the lungs daily as needed for wheezing or shortness of breath.  03/16/15  Yes Historical Provider, MD  Selenium (SELENIMIN PO) Take 1 tablet by mouth daily.    Historical Provider, MD    Physical Exam: Filed Vitals:   12/22/15 2030 12/22/15 2045 12/22/15 2145 12/22/15 2200  BP: 142/91  141/99 134/88 133/90  Pulse: 87  87 94  Temp:      TempSrc:      Resp: 21 21 29 23   Height:      Weight:      SpO2: 95%  96% 96%      Constitutional: NAD, calm, comfortable Eyes: PERRL, lids and conjunctivae normal ENMT: Mucous membranes are moist. Posterior pharynx clear of any exudate or lesions.Normal dentition.  Neck: normal, supple, no masses, no thyromegaly Respiratory: clear to auscultation bilaterally, no wheezing, no crackles. Normal respiratory effort. No accessory muscle use.  Cardiovascular: Regular rate and rhythm, no murmurs / rubs / gallops. No extremity edema. 2+ pedal pulses. No carotid bruits.  Abdomen: no tenderness, no masses palpated. No hepatosplenomegaly. Bowel sounds positive.  Musculoskeletal: no clubbing / cyanosis. No joint deformity upper and lower extremities. Good ROM, no contractures. Normal muscle tone.  Skin: no rashes, lesions, ulcers. No induration Neurologic: CN 2-12 grossly intact. Sensation intact, DTR normal. Strength 5/5 in all 4.  Psychiatric: Normal judgment and insight. Alert and oriented x 3. Normal mood.    Labs on Admission: I have personally reviewed following labs and imaging studies  CBC:  Recent Labs Lab 12/22/15 1517  WBC 16.3*  HGB 17.7*  HCT 50.8  MCV 87.9  PLT 194   Basic Metabolic Panel:  Recent Labs Lab 12/22/15 1517  NA 139  K 3.9  CL 101  CO2 28  GLUCOSE 98  BUN 11  CREATININE 1.23  CALCIUM 9.6   GFR: Estimated Creatinine Clearance: 82 mL/min (by C-G formula based on Cr of 1.23). Liver Function Tests:  Recent Labs Lab 12/22/15 1517  AST 23  ALT 21  ALKPHOS 57  BILITOT 0.8  PROT 7.2  ALBUMIN 4.4   No results for input(s): LIPASE, AMYLASE in the last 168 hours. No results for input(s): AMMONIA in the last 168 hours. Coagulation Profile: No results for input(s): INR, PROTIME in the last 168 hours. Cardiac Enzymes: No results for input(s): CKTOTAL, CKMB, CKMBINDEX, TROPONINI in the last 168  hours. BNP (last 3 results) No results for input(s): PROBNP in the last 8760 hours. HbA1C: No results for input(s): HGBA1C in the last 72 hours. CBG: No results for input(s): GLUCAP in the last 168 hours. Lipid Profile: No results for input(s): CHOL, HDL, LDLCALC, TRIG, CHOLHDL, LDLDIRECT in the last 72 hours. Thyroid Function Tests: No results for input(s): TSH, T4TOTAL, FREET4, T3FREE, THYROIDAB in the last 72 hours. Anemia Panel: No results for input(s): VITAMINB12, FOLATE, FERRITIN, TIBC, IRON, RETICCTPCT in the last 72 hours. Urine analysis:    Component Value Date/Time   COLORURINE YELLOW 12/22/2015 1730   APPEARANCEUR CLEAR 12/22/2015 1730   LABSPEC 1.013 12/22/2015 1730   PHURINE 6.5 12/22/2015 1730   GLUCOSEU NEGATIVE 12/22/2015 1730   HGBUR NEGATIVE 12/22/2015 1730   BILIRUBINUR NEGATIVE 12/22/2015 1730   KETONESUR 15* 12/22/2015 1730   PROTEINUR NEGATIVE 12/22/2015 1730  NITRITE NEGATIVE 12/22/2015 1730   LEUKOCYTESUR NEGATIVE 12/22/2015 1730   Sepsis Labs: (procalcitonin:4,lacticidven:4) )No results found for this or any previous visit (from the past 240 hour(s)).   Radiological Exams on Admission: Dg Chest 2 View  12/22/2015  CLINICAL DATA:  Chest pain for 1 day EXAM: CHEST  2 VIEW COMPARISON:  04/21/2015 FINDINGS: Cardiomediastinal silhouette is stable. No infiltrate or pleural effusion. No pulmonary edema. Mild dextroscoliosis mid thoracic spine. IMPRESSION: No active cardiopulmonary disease. Electronically Signed   By: Natasha Mead M.D.   On: 12/22/2015 16:46    EKG: Independently reviewed.  Assessment/Plan Active Problems:   Chest pain with low risk for cardiac etiology  Chest pain - HEART score of 3  CP obs pathway  Serial trops  Stress test ordered for tomorrow  Morphine PRN pain, patient declines NTG as he dosent want to have a migraine he says.  On further review he has Revatio on his med list anyhow and he states he took this last night,  so NTG is contraindicated anyhow.  If stress test is negative, then next step is probably GI consultation as he has recent history of esophageal ulcers, pill esophagitis, continues to take ASA 325 up to 2x a day as needed, etc.   DVT prophylaxis: Lovenox Code Status: Full Family Communication: No family in room Consults called: None Admission status: Admit to obs   Hillary Bow DO [facility] Pager [phone] from 7PM-7AM  If 7AM-7PM, please contact the day physician for the patient [email or web address] Password [facility]  12/22/2015, 10:19 PM

## 2015-12-22 NOTE — ED Notes (Signed)
MD at bedside. 

## 2015-12-22 NOTE — ED Notes (Signed)
[place] reports feeling like a "knot" in his throat and cramping to center of chest x 2-3 hours while at work.  Reports "hard to breathe" and "I can't get a good breath in."  Denies nausea, vomiting, diaphoresis.

## 2015-12-22 NOTE — ED Notes (Signed)
Admitting MD at bedside.

## 2015-12-22 NOTE — ED Notes (Addendum)
After blood obtained pt became dizzy and pale.  Pt had unresponsive episode with snoring respirations that lasted approx 20 seconds. Pt placed in trendelenburg with cold washcloth on forehead.  Pt now alert and oriented.  Another deputy in room at this time to call pt's wife and take pt's duty weapon.  Pt still reports cramping to center of chest 5/10.

## 2015-12-23 ENCOUNTER — Observation Stay (HOSPITAL_COMMUNITY): Payer: 59

## 2015-12-23 ENCOUNTER — Encounter (HOSPITAL_COMMUNITY): Payer: Self-pay | Admitting: Physician Assistant

## 2015-12-23 DIAGNOSIS — F458 Other somatoform disorders: Secondary | ICD-10-CM | POA: Diagnosis not present

## 2015-12-23 DIAGNOSIS — I309 Acute pericarditis, unspecified: Secondary | ICD-10-CM | POA: Diagnosis present

## 2015-12-23 DIAGNOSIS — I10 Essential (primary) hypertension: Secondary | ICD-10-CM | POA: Diagnosis present

## 2015-12-23 DIAGNOSIS — E236 Other disorders of pituitary gland: Secondary | ICD-10-CM | POA: Diagnosis present

## 2015-12-23 DIAGNOSIS — R0989 Other specified symptoms and signs involving the circulatory and respiratory systems: Secondary | ICD-10-CM | POA: Diagnosis present

## 2015-12-23 DIAGNOSIS — R079 Chest pain, unspecified: Secondary | ICD-10-CM | POA: Diagnosis not present

## 2015-12-23 LAB — NM MYOCAR MULTI W/SPECT W/WALL MOTION / EF
CSEPED: 7 min
CSEPEDS: 12 s
Estimated workload: 1 METS
Peak HR: 108 {beats}/min
Rest HR: 67 {beats}/min

## 2015-12-23 LAB — TROPONIN I: Troponin I: <0.03 ng/mL (ref <0.031)

## 2015-12-23 LAB — MRSA PCR SCREENING: MRSA BY PCR: NEGATIVE

## 2015-12-23 MED ORDER — COLCHICINE 0.6 MG PO TABS
0.6000 mg | ORAL_TABLET | Freq: Two times a day (BID) | ORAL | Status: DC
  Administered 2015-12-23: 0.6 mg via ORAL
  Filled 2015-12-23: qty 1

## 2015-12-23 MED ORDER — COLCHICINE 0.6 MG PO TABS
0.6000 mg | ORAL_TABLET | Freq: Two times a day (BID) | ORAL | Status: DC
Start: 2015-12-23 — End: 2016-01-03

## 2015-12-23 MED ORDER — TECHNETIUM TC 99M TETROFOSMIN IV KIT
10.0000 | PACK | Freq: Once | INTRAVENOUS | Status: AC | PRN
  Administered 2015-12-23: 10 via INTRAVENOUS

## 2015-12-23 MED ORDER — REGADENOSON 0.4 MG/5ML IV SOLN
INTRAVENOUS | Status: AC
  Administered 2015-12-23: 0.4 mg via INTRAVENOUS
  Filled 2015-12-23: qty 5

## 2015-12-23 MED ORDER — TECHNETIUM TC 99M TETROFOSMIN IV KIT
30.0000 | PACK | Freq: Once | INTRAVENOUS | Status: AC | PRN
  Administered 2015-12-23: 30 via INTRAVENOUS

## 2015-12-23 MED ORDER — LEVOTHYROXINE SODIUM 25 MCG PO TABS
125.0000 ug | ORAL_TABLET | Freq: Every day | ORAL | Status: DC
  Administered 2015-12-23: 125 ug via ORAL
  Filled 2015-12-23: qty 1

## 2015-12-23 MED ORDER — REGADENOSON 0.4 MG/5ML IV SOLN
0.4000 mg | Freq: Once | INTRAVENOUS | Status: AC
  Administered 2015-12-23: 0.4 mg via INTRAVENOUS
  Filled 2015-12-23: qty 5

## 2015-12-23 MED ORDER — IBUPROFEN 200 MG PO TABS: 400.0000 mg | ORAL_TABLET | Freq: Three times a day (TID) | ORAL | Status: DC

## 2015-12-23 MED ORDER — IBUPROFEN 200 MG PO TABS
400.0000 mg | ORAL_TABLET | Freq: Three times a day (TID) | ORAL | Status: DC
  Administered 2015-12-23 (×2): 400 mg via ORAL
  Filled 2015-12-23 (×2): qty 2

## 2015-12-23 NOTE — Discharge Summary (Signed)
Physician Discharge Summary  Michael Warren ZOX:[id number] DOB: 01-Sep-1970 DOA: 12/22/2015  PCP: No primary care provider on file.  Admit date: 12/22/2015 Discharge date: 12/23/2015  Time spent: 35 minutes  Recommendations for Outpatient Follow-up: Pericarditis -Patient evaluated by cardiology this Who performed nuclear medicine stress test. Negative reversible ischemia however EF 45% see results below. - EKG concerning for pericarditis. D-dimer and troponin is negative. Symptoms worse with deep breath. -Cardiology requested Echocardiogram early Am 6/15 unfortunately was not completed. However per cardiology note could be completed as outpatient.  -Colchicine 0.6 mg  BID -Motrin 400 mg TID.  -Follow-up with Dr. Leodis Sias [facility] cardiology in 1-2 weeks for echocardiogram.  Sensation of lump in throat (globus) -Resolved patient able to consume meals. - different then esophageal ulcer symptoms. Will differ further evaluation to primary.   HTN   - blood pressure stable. Will defer to PCP if/when to start BP medication   Palpitation - previous monitor placement showed PVCs. Telemetry showed PVCs and PACs.  -Seems related to caffeine intake. Advised to cut back.   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   Discharge Diagnoses:  Active Problems:   Chest pain with low risk for cardiac etiology   Acute pericarditis   Globus sensation   Essential hypertension   Empty sella syndrome [facility])   Discharge Condition: Stable  Diet recommendation: Heart healthy  Filed Weights   12/22/15 1517 12/23/15 0109  Weight: 87.091 kg (192 lb) 90.039 kg (198 lb 8 oz)    History of present illness:  45 y.o. 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   Patient presents to the ED with c/o central chest and throat pain. Symptoms onset 2 hours PTA,  initially had no associated nausea, vomiting, nor SOB. Since onset has developed nausea, diaphoresis, and had syncopal episode for 20 seconds while having blood drawn in triage. This feels like a "knot" in his throat. During his hospitalization patient was evaluated for cardiac ischemia vs MI. Patient found to have pericarditis and a mildly decreased EF of 45%. Patient understands will require follow-up with cardiology to complete risk stratification i.e. requires echocardiogram.   Procedures: 6/15 nuclear medicine stress test.-No reversible ischemia or infarction.- Mild global hypokinesis; LVEF= 45%   Consultations: Dr.Philip J Nahser [facility] cardiology      Discharge Exam: Filed Vitals:   12/23/15 1200 12/23/15 1342 12/23/15 1800 12/23/15 1816  BP:  140/100    Pulse: 85 82 84   Temp:  98.1 F (36.7 C)  98.7 F (37.1 C)  TempSrc:  Oral  Oral  Resp: 21 18 27    Height:      Weight:      SpO2: 98% 98% 97%     General: A/O 4, NAD Cardiovascular: Regular rhythm and rate, negative murmurs rubs or gallops, normal S1/S2 Respiratory: Clear to auscultation bilateral  Discharge Instructions     Medication List    TAKE these medications        aspirin 325 MG EC tablet  Take 650 mg by mouth 2 (two) times daily as needed for pain (or headaches).     B-D 3CC LUER-LOK SYR 21GX1-1/2 21G X 1-1/2" 3 ML Misc  Generic drug:  SYRINGE-NEEDLE (DISP) 3 ML  AS DIRECTED FOR USE WITH TESTOSTERONE IM     bismuth subsalicylate 262 MG/15ML suspension  Commonly known as:  PEPTO BISMOL  Take 30 mLs by mouth  2 (two) times daily as needed for indigestion.     calcium carbonate 500 MG chewable tablet  Commonly known as:  TUMS - dosed in mg elemental calcium  Chew 2 tablets by mouth 2 (two) times daily as needed for indigestion or heartburn.     cetirizine 10 MG tablet  Commonly known as:  ZYRTEC  Take 10 mg by mouth daily as needed for allergies.     colchicine 0.6 MG tablet  Take 1  tablet (0.6 mg total) by mouth 2 (two) times daily.     DHEA PO  Take 1 tablet by mouth daily.     ibuprofen 200 MG tablet  Commonly known as:  ADVIL,MOTRIN  Take 2 tablets (400 mg total) by mouth 3 (three) times daily.     L-ARGININE PO  Take 1 capsule by mouth daily.     levothyroxine 125 MCG tablet  Commonly known as:  SYNTHROID, LEVOTHROID  Take 125 mcg by mouth daily.     liothyronine 5 MCG tablet  Commonly known as:  CYTOMEL  Take 5 mcg by mouth daily.     MAGNESIUM PO  Take 1 tablet by mouth daily.     multivitamin with minerals Tabs tablet  Take 1 tablet by mouth daily.     omeprazole 40 MG capsule  Commonly known as:  PRILOSEC  Take 40 mg by mouth daily.     oxymetazoline 0.05 % nasal spray  Commonly known as:  AFRIN  Place 1 spray into both nostrils daily as needed for congestion.     SELENIMIN PO  Take 1 tablet by mouth daily.     sildenafil 20 MG tablet  Commonly known as:  REVATIO  Take 20 mg by mouth daily as needed (as directed).     testosterone cypionate 200 MG/ML injection  Commonly known as:  DEPOTESTOSTERONE CYPIONATE  Inject 200 mg into the muscle every 14 (fourteen) days.     VENTOLIN HFA 108 (90 Base) MCG/ACT inhaler  Generic drug:  albuterol  Inhale 2 puffs into the lungs daily as needed for wheezing or shortness of breath.     Vitamin D3 5000 units Caps  Take 5,000 Units by mouth daily.       No Known Allergies Follow-up Information    Follow up with Kristeen Miss, MD In 1 week.   Specialty:  Cardiology   Why:  Follow-up in 1 to 2 weeks for echocardiogram   Contact information:   [address] [place] [address] [phone]        The results of significant diagnostics from this hospitalization (including imaging, microbiology, ancillary and laboratory) are listed below for reference.    Significant Diagnostic Studies: Dg Chest 2 View  12/22/2015  CLINICAL DATA:  Chest pain for 1 day EXAM: CHEST  2 VIEW  COMPARISON:  04/21/2015 FINDINGS: Cardiomediastinal silhouette is stable. No infiltrate or pleural effusion. No pulmonary edema. Mild dextroscoliosis mid thoracic spine. IMPRESSION: No active cardiopulmonary disease. Electronically Signed   By: Natasha Mead M.D.   On: 12/22/2015 16:46   Nm Myocar Multi W/spect W/wall Motion / Ef  12/23/2015  CLINICAL DATA:  Chest pain.  History of syncope and hypertension. EXAM: MYOCARDIAL IMAGING WITH SPECT (REST AND PHARMACOLOGIC-STRESS) GATED LEFT VENTRICULAR WALL MOTION STUDY LEFT VENTRICULAR EJECTION FRACTION TECHNIQUE: Standard myocardial SPECT imaging was performed after resting intravenous injection of 10 mCi Tc-65m tetrofosmin. Subsequently, intravenous infusion of Lexiscan was performed under the supervision of the Cardiology staff.  At peak effect of the drug, 30 mCi Tc-8511m tetrofosmin was injected intravenously and standard myocardial SPECT imaging was performed. Quantitative gated imaging was also performed to evaluate left ventricular wall motion, and estimate left ventricular ejection fraction. COMPARISON:  Chest radiographs 12/22/2015 FINDINGS: Perfusion: No decreased activity in the left ventricle on stress imaging to suggest reversible ischemia or infarction. There is mild septal thinning which appears fixed. Wall Motion: Mild global hypokinesis. Left Ventricular Ejection Fraction: 45 % End diastolic volume 116 ml End systolic volume 63 ml IMPRESSION: 1. No reversible ischemia or infarction. 2. Mild global hypokinesis. 3. Left ventricular ejection fraction 45% 4. Non invasive risk stratification*: Intermediate *2012 Appropriate Use Criteria for Coronary Revascularization Focused Update: J Am Coll Cardiol. 2012;59(9):857-881. http://content.dementiazones.comonlinejacc.org/article.aspx?articleid=1201161 Electronically Signed   By: Carey BullocksWilliam  Veazey M.D.   On: 12/23/2015 12:22    Microbiology: Recent Results (from the past 240 hour(s))  MRSA PCR Screening     Status: None    Collection Time: 12/23/15  1:13 AM  Result Value Ref Range Status   MRSA by PCR NEGATIVE NEGATIVE Final    Comment:        The GeneXpert MRSA Assay (FDA approved for NASAL specimens only), is one component of a comprehensive MRSA colonization surveillance program. It is not intended to diagnose MRSA infection nor to guide or monitor treatment for MRSA infections.      Labs: Basic Metabolic Panel:  Recent Labs Lab 12/22/15 1517  NA 139  K 3.9  CL 101  CO2 28  GLUCOSE 98  BUN 11  CREATININE 1.23  CALCIUM 9.6   Liver Function Tests:  Recent Labs Lab 12/22/15 1517  AST 23  ALT 21  ALKPHOS 57  BILITOT 0.8  PROT 7.2  ALBUMIN 4.4   No results for input(s): LIPASE, AMYLASE in the last 168 hours. No results for input(s): AMMONIA in the last 168 hours. CBC:  Recent Labs Lab 12/22/15 1517  WBC 16.3*  HGB 17.7*  HCT 50.8  MCV 87.9  PLT 194   Cardiac Enzymes:  Recent Labs Lab 12/22/15 2248 12/23/15 0151 12/23/15 0500 12/23/15 1217  TROPONINI <0.03 <0.03 <0.03 <0.03   BNP: BNP (last 3 results) No results for input(s): BNP in the last 8760 hours.  ProBNP (last 3 results) No results for input(s): PROBNP in the last 8760 hours.  CBG: No results for input(s): GLUCAP in the last 168 hours.     Signed:  Carolyne Littlesurtis Woods, MD [facility] [phone] pager

## 2015-12-23 NOTE — Progress Notes (Signed)
Pt arrived to 2C04 from the ED at 0100. Pt is alert and oriented x4, VS's WNL, the pain he's been having is at a 2 and worsens with movement or strain.  Pt says he has also had a neck ache today. RN will continue to monitor.

## 2015-12-23 NOTE — Consult Note (Signed)
CARDIOLOGY CONSULT NOTE   Patient ID: Michael Warren MRN: [id number] DOB/AGE: 08/14/1970 45 y.o.  Admit date: 12/22/2015  Primary Physician   No primary care provider on file. Primary Cardiologist   New Reason for Consultation   Abnormal EKG concerning for pericarditis Requesting Physician  Dr. Joseph ArtWoods  HPI: Michael Denverrennon Bogard is a 45 y.o. male with a history of elevated blood pressure, partial empty sella syndrome on hormone replacement, esophageal ulcers  (sounds like pill esophagitis diagnosed by endoscopy 2016 who seen in Nuclear medicine for stress test and found out that EKG is concerning for pericarditis and cardiology asked for consultation.   [facility]  for evaluation of "lump in his throat". His symptoms started yesterday morning while driving. This eventually became painful that has got worse in 2-3 hours. Afterwards it radiated to upper sternal area and described as "dull pressure". Initially he had no associated nausea or vomiting. However due to worsening of symptoms he came to ER for further evaluation. While drawing blood he suddenly became nauseated, pale and diaphoretic and has passed out for 20 minutes. This feels different from his esophageal ulcer. Taking deep breath make it worse. No difficulty with swallowing. Recently more fatigued and has exertion dyspnea.   He has hx of palpitations x 15 years. Monitor showed PVCs. Says he has this "all the times now better than previously". He used to drink a jar of coffee, currently drinks 1-2 cups of coffee every day. Hx of elevated blood pressure and never started on medicine. Denies hx of tobacco abuse or illicit drug use. [place] and had tick bit e3 weeks ago but denies fever, chills, cough, congestion or rash. No family hx of heart disease. Denies LE edema, dizziness, orthopnea, or PND.    Work up reveled troponin negative x 2. D-dimer negative. CXR clear. Lytes normal. WBC of 16.3. EKG showed normal sinus  rhythm at rate of 92 bpm and ST elevation with PR depression in inferior lateral lead.   Past Medical History  Diagnosis Date  . Thyroid disease   . HTN (hypertension)   . Esophageal ulcer   . Empty sella syndrome (HCC)     partial empty sella syndrome on hormone replacement      No pertinent past surgical history.  No Known Allergies  I have reviewed the patient's current medications . enoxaparin (LOVENOX) injection  40 mg Subcutaneous Daily  . levothyroxine  125 mcg Oral QAC breakfast  . liothyronine  5 mcg Oral Daily  . loratadine  10 mg Oral Daily  . pantoprazole  80 mg Oral Daily     acetaminophen, albuterol, bismuth subsalicylate, calcium carbonate, morphine injection, ondansetron (ZOFRAN) IV  Prior to Admission medications   Medication Sig Start Date End Date Taking? Authorizing Provider  aspirin 325 MG EC tablet Take 650 mg by mouth 2 (two) times daily as needed for pain (or headaches).    Yes Historical Provider, MD  B-D 3CC LUER-LOK SYR 21GX1-1/2 21G X 1-1/2" 3 ML MISC AS DIRECTED FOR USE WITH TESTOSTERONE IM 11/07/15  Yes Historical Provider, MD  bismuth subsalicylate (PEPTO BISMOL) 262 MG/15ML suspension Take 30 mLs by mouth 2 (two) times daily as needed for indigestion.   Yes Historical Provider, MD  calcium carbonate (TUMS - DOSED IN MG ELEMENTAL CALCIUM) 500 MG chewable tablet Chew 2 tablets by mouth 2 (two) times daily as needed for indigestion or heartburn.   Yes Historical Provider, MD  cetirizine (ZYRTEC) 10  MG tablet Take 10 mg by mouth daily as needed for allergies.   Yes Historical Provider, MD  Cholecalciferol (VITAMIN D3) 5000 UNITS CAPS Take 5,000 Units by mouth daily.   Yes Historical Provider, MD  ibuprofen (ADVIL,MOTRIN) 200 MG tablet Take 400 mg by mouth 2 (two) times daily as needed for moderate pain.   Yes Historical Provider, MD  L-ARGININE PO Take 1 capsule by mouth daily.   Yes Historical Provider, MD  levothyroxine (SYNTHROID, LEVOTHROID) 125 MCG  tablet Take 125 mcg by mouth daily.   Yes Historical Provider, MD  liothyronine (CYTOMEL) 5 MCG tablet Take 5 mcg by mouth daily. 02/24/15  Yes Historical Provider, MD  MAGNESIUM PO Take 1 tablet by mouth daily.   Yes Historical Provider, MD  Multiple Vitamin (MULTIVITAMIN WITH MINERALS) TABS tablet Take 1 tablet by mouth daily.   Yes Historical Provider, MD  Nutritional Supplements (DHEA PO) Take 1 tablet by mouth daily.   Yes Historical Provider, MD  omeprazole (PRILOSEC) 40 MG capsule Take 40 mg by mouth daily. 12/17/15  Yes Historical Provider, MD  oxymetazoline (AFRIN) 0.05 % nasal spray Place 1 spray into both nostrils daily as needed for congestion.   Yes Historical Provider, MD  sildenafil (REVATIO) 20 MG tablet Take 20 mg by mouth daily as needed (as directed).  12/16/15  Yes Historical Provider, MD  testosterone cypionate (DEPOTESTOTERONE CYPIONATE) 200 MG/ML injection Inject 200 mg into the muscle every 14 (fourteen) days.   Yes Historical Provider, MD  VENTOLIN HFA 108 (90 BASE) MCG/ACT inhaler Inhale 2 puffs into the lungs daily as needed for wheezing or shortness of breath.  03/16/15  Yes Historical Provider, MD  Selenium (SELENIMIN PO) Take 1 tablet by mouth daily.    Historical Provider, MD     Social History   Social History  . Marital Status: Married    Spouse Name: N/A  . Number of Children: N/A  . Years of Education: N/A   Occupational History  . Not on file.   Social History Main Topics  . Smoking status: Never Smoker   . Smokeless tobacco: Not on file  . Alcohol Use: Yes  . Drug Use: No  . Sexual Activity: Not on file   Other Topics Concern  . Not on file   Social History Narrative    Family Status  Relation Status Death Age  . Mother Alive   . Father Alive    No family hx of cardiac disease. Mother has empty sella syndrome s/p surgery  ROS:  Full 14 point review of systems complete and found to be negative unless listed above.  Physical Exam: Blood  pressure 128/92, pulse 76, temperature 98 F (36.7 C), temperature source Oral, resp. rate 18, height 5\' 9"  (1.753 m), weight 198 lb 8 oz (90.039 kg), SpO2 93 %.  General: Well developed, well nourished, male in no acute distress Head: Eyes PERRLA, No xanthomas. Normocephalic and atraumatic, oropharynx without edema or exudate.  Lungs: Resp regular and unlabored, CTA. Heart: RRR no s3, s4, or murmurs..   Neck: No carotid bruits. No lymphadenopathy. No JVD. Abdomen: Bowel sounds present, abdomen soft and non-tender without masses or hernias noted. Msk:  No spine or cva tenderness. No weakness, no joint deformities or effusions. Extremities: No clubbing, cyanosis or edema. DP/PT/Radials 2+ and equal bilaterally. Neuro: Alert and oriented X 3. No focal deficits noted. Psych:  Good affect, responds appropriately Skin: No rashes or lesions noted.  Labs:   Lab Results  Component Value Date   WBC 16.3* 12/22/2015   HGB 17.7* 12/22/2015   HCT 50.8 12/22/2015   MCV 87.9 12/22/2015   PLT 194 12/22/2015   No results for input(s): INR in the last 72 hours.   Recent Labs Lab 12/22/15 1517  NA 139  K 3.9  CL 101  CO2 28  BUN 11  CREATININE 1.23  CALCIUM 9.6  PROT 7.2  BILITOT 0.8  ALKPHOS 57  ALT 21  AST 23  GLUCOSE 98  ALBUMIN 4.4   No results found for: MG  Recent Labs  12/22/15 2248 12/23/15 0151 12/23/15 0500  TROPONINI <0.03 <0.03 <0.03    Recent Labs  12/22/15 1526 12/22/15 1856  TROPIPOC 0.00 0.00   No results found for: PROBNP No results found for: CHOL, HDL, LDLCALC, TRIG Lab Results  Component Value Date   DDIMER <0.27 12/22/2015   LIPASE  Date/Time Value Ref Range Status  04/22/2015 02:42 PM 27 22 - 51 U/L Final    ECG:  EKG showed normal sinus rhythm at rate of 92 bpm and ST elevation with PR depression in inferior lateral lead.    Radiology:  Dg Chest 2 View  12/22/2015  CLINICAL DATA:  Chest pain for 1 day EXAM: CHEST  2 VIEW COMPARISON:   04/21/2015 FINDINGS: Cardiomediastinal silhouette is stable. No infiltrate or pleural effusion. No pulmonary edema. Mild dextroscoliosis mid thoracic spine. IMPRESSION: No active cardiopulmonary disease. Electronically Signed   By: Natasha Mead M.D.   On: 12/22/2015 16:46    ASSESSMENT AND PLAN:     1. Pericarditis - EKG concerning for pericarditis. Has chest discomfort concerning for pleuritic chest pain. D-dimer and troponin is negative. Symptoms worse with deep breath. Unclear etiology however had tick bite but denies fever, chills, cough, congestion or rash. Will treat with NSAID.   2. Sensation of lump in throat - different then esophageal ulcer symptoms. Will differ further evaluation to primary.   3. Hx of elevated blood pressure  - blood pressure stable here  4. Palpitation - previous monitor placement showed PVCs. Telemetry showed PVCs and PACs. Seems related to caffeine intake. Advised to cut back.   5. 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  Agree with assessment and plan as noted above.  Changes made to the above note as needed.  Patient seen and independently examined with Chelsea Aus, PA .   We discussed all aspects of the encounter. I agree with the assessment and plan as stated above.  Pt presented with pleuretic CP yesterday .   Definite worsening with deep breath. Is active , works out regularly .   No hx of angina  No family hx of CAD  Had CP for hours. Better this am  ECG yesterday showed NS changes. Today, the ECg in the stress lab showed changes c/w pericarditis.  ( diffuse ST elevation, PR depression)  Pain is better.  Will get 1 additional troponin Will let him eat. Echo   OK for DC later today .   He can get the echo as OP if it cannot be done today   start colchicine 0.6 mg BID  Motrin 400 TID     I have spent a total of 40 minutes with patient reviewing hospital  notes , telemetry, EKGs, labs and examining patient as well as establishing an assessment and  plan that was discussed with the patient. > 50% of time was spent in direct patient care.    Vesta Mixer, Montez Hageman., MD, [facility] 12/23/2015, 11:19 AM [address]  [address] Office [phone] Pager [phone]

## 2015-12-23 NOTE — Progress Notes (Signed)
Patient presented for Lexiscan. Tolerated procedure well. Result to follow. EKG and symptoms concerning for pericarditis. Updated attending Dr. Joseph ArtWoods.   Nielle Duford, PAC

## 2015-12-24 ENCOUNTER — Other Ambulatory Visit: Payer: Self-pay | Admitting: Nurse Practitioner

## 2015-12-24 DIAGNOSIS — R079 Chest pain, unspecified: Secondary | ICD-10-CM

## 2015-12-28 ENCOUNTER — Ambulatory Visit (HOSPITAL_COMMUNITY): Payer: 59 | Attending: Cardiology

## 2015-12-28 ENCOUNTER — Other Ambulatory Visit: Payer: Self-pay

## 2015-12-28 DIAGNOSIS — R079 Chest pain, unspecified: Secondary | ICD-10-CM | POA: Diagnosis present

## 2015-12-28 DIAGNOSIS — I517 Cardiomegaly: Secondary | ICD-10-CM | POA: Insufficient documentation

## 2015-12-28 DIAGNOSIS — I340 Nonrheumatic mitral (valve) insufficiency: Secondary | ICD-10-CM | POA: Diagnosis not present

## 2015-12-28 LAB — ECHOCARDIOGRAM COMPLETE
E decel time: 278 msec
EERAT: 4.63
FS: 31 % (ref 28–44)
IV/PV OW: 0.89
LA diam end sys: 37 mm
LA diam index: 1.8 cm/m2
LA vol A4C: 49 ml
LA vol index: 23.8 mL/m2
LA vol: 49 mL
LASIZE: 37 mm
LV E/e'average: 4.63
LV TDI E'LATERAL: 11.4
LV TDI E'MEDIAL: 6.8
LV e' LATERAL: 11.4 cm/s
LVEEMED: 4.63
LVOT VTI: 18 cm
LVOT area: 3.46 cm2
LVOT peak vel: 105 cm/s
LVOTD: 21 mm
LVOTSV: 62 mL
MV Dec: 278
MV pk A vel: 74.5 m/s
MV pk E vel: 52.8 m/s
PW: 13.4 mm — AB (ref 0.6–1.1)

## 2016-01-03 ENCOUNTER — Encounter: Payer: Self-pay | Admitting: Cardiovascular Disease

## 2016-01-03 ENCOUNTER — Encounter: Payer: Self-pay | Admitting: Nurse Practitioner

## 2016-01-03 ENCOUNTER — Ambulatory Visit (INDEPENDENT_AMBULATORY_CARE_PROVIDER_SITE_OTHER): Payer: 59 | Admitting: Cardiovascular Disease

## 2016-01-03 VITALS — BP 120/80 | HR 64 | Ht 69.0 in | Wt 200.4 lb

## 2016-01-03 DIAGNOSIS — I10 Essential (primary) hypertension: Secondary | ICD-10-CM

## 2016-01-03 DIAGNOSIS — I309 Acute pericarditis, unspecified: Secondary | ICD-10-CM

## 2016-01-03 NOTE — Patient Instructions (Addendum)
Medication Instructions:  Your physician recommends that you continue on your current medications as directed. Please refer to the Current Medication list given to you today.   Labwork: None Ordered   Testing/Procedures: None Ordered   Follow-Up: Your physician recommends that you return for a follow-up appointment on Wednesday August 16 @ 9:15 am  Your physician has requested that you regularly monitor and record your blood pressure readings at home. Please use the same machine at the same time of day to check your readings and record them to bring to your follow-up visit.    If you need a refill on your cardiac medications before your next appointment, please call your pharmacy.   Thank you for choosing [facility]! Eligha BridegroomMichelle Rionna Feltes, RN [phone]

## 2016-01-03 NOTE — Progress Notes (Signed)
Cardiology Office Note   Date:  01/03/2016   ID:  Michael Warren, DOB 1971/04/20, MRN [id number]  PCP:  Gweneth DimitriMCNEILL,WENDY, MD  Cardiologist:   Kristeen MissPhilip Nahser, MD   Chief Complaint  Patient presents with  . Shortness of Breath   Problem List 1. Pericarditis 2. Dyspnea    History of Present Illness: Michael Warren is a 45 y.o. male who presents for follow up for his pericarditis.  His pleuretic CP is much better.   Still has DOE.  Is on Mitiare ( brand name for colchicine )   BP has been elevated.     Watches his salt .  Has PVCs On occasion    Past Medical History  Diagnosis Date  . Thyroid disease   . Elevated blood pressure   . Esophageal ulcer   . Empty sella syndrome (HCC)     partial empty sella syndrome on hormone replacement    Past Surgical History  Procedure Laterality Date  . No past surgeries       Current Outpatient Prescriptions  Medication Sig Dispense Refill  . B-D 3CC LUER-LOK SYR 21GX1-1/2 21G X 1-1/2" 3 ML MISC AS DIRECTED FOR USE WITH TESTOSTERONE IM  5  . calcium carbonate (TUMS - DOSED IN MG ELEMENTAL CALCIUM) 500 MG chewable tablet Chew 2 tablets by mouth 2 (two) times daily as needed for indigestion or heartburn.    . cetirizine (ZYRTEC) 10 MG tablet Take 10 mg by mouth daily as needed for allergies.    . Cholecalciferol (VITAMIN D3) 5000 UNITS CAPS Take 5,000 Units by mouth daily.    Marland Kitchen. ibuprofen (ADVIL,MOTRIN) 200 MG tablet Take 2 tablets (400 mg total) by mouth 3 (three) times daily. 30 tablet 0  . L-ARGININE PO Take 1 capsule by mouth daily.    Marland Kitchen. levothyroxine (SYNTHROID, LEVOTHROID) 125 MCG tablet Take 125 mcg by mouth daily.    Marland Kitchen. liothyronine (CYTOMEL) 5 MCG tablet Take 5 mcg by mouth daily.    Marland Kitchen. MAGNESIUM PO Take 1 tablet by mouth daily.    Marland Kitchen. MITIGARE 0.6 MG CAPS Take 0.6 mg by mouth 2 (two) times daily.  0  . Nutritional Supplements (DHEA PO) Take 1 tablet by mouth daily.    Marland Kitchen. omeprazole (PRILOSEC) 40 MG capsule Take 40 mg by mouth  daily.    Marland Kitchen. oxymetazoline (AFRIN) 0.05 % nasal spray Place 1 spray into both nostrils daily as needed for congestion.    . sildenafil (REVATIO) 20 MG tablet Take 20 mg by mouth daily as needed (as directed).     Marland Kitchen. testosterone cypionate (DEPOTESTOTERONE CYPIONATE) 200 MG/ML injection Inject 200 mg into the muscle every 14 (fourteen) days.    . VENTOLIN HFA 108 (90 BASE) MCG/ACT inhaler Inhale 2 puffs into the lungs daily as needed for wheezing or shortness of breath.     Marland Kitchen. aspirin 325 MG EC tablet Take 650 mg by mouth 2 (two) times daily as needed for pain (or headaches). Reported on 01/03/2016     No current facility-administered medications for this visit.    Allergies:   Review of patient's allergies indicates no known allergies.    Social History:  The patient  reports that he has never smoked. He does not have any smokeless tobacco history on file. He reports that he drinks alcohol. He reports that he does not use illicit drugs.   Family History:  The patient's family history includes Coronary artery disease in his maternal grandfather and maternal grandmother; Goiter in  his mother.    ROS:  Please see the history of present illness.    Review of Systems: Constitutional:  denies fever, chills, diaphoresis, appetite change and fatigue.  HEENT: denies photophobia, eye pain, redness, hearing loss, ear pain, congestion, sore throat, rhinorrhea, sneezing, neck pain, neck stiffness and tinnitus.  Respiratory: admits to SOB, DOE, cough, chest tightness, and wheezing.  Cardiovascular: denies chest pain, palpitations and leg swelling.  Gastrointestinal: denies nausea, vomiting, abdominal pain, diarrhea, constipation, blood in stool.  Genitourinary: denies dysuria, urgency, frequency, hematuria, flank pain and difficulty urinating.  Musculoskeletal: denies  myalgias, back pain, joint swelling, arthralgias and gait problem.   Skin: denies pallor, rash and wound.  Neurological: denies  dizziness, seizures, syncope, weakness, light-headedness, numbness and headaches.   Hematological: denies adenopathy, easy bruising, personal or family bleeding history.  Psychiatric/ Behavioral: denies suicidal ideation, mood changes, confusion, nervousness, sleep disturbance and agitation.       All other systems are reviewed and negative.    PHYSICAL EXAM: VS:  BP 120/80 mmHg  Pulse 64  Ht 5\' 9"  (1.753 m)  Wt 200 lb 6.4 oz (90.901 kg)  BMI 29.58 kg/m2 , BMI Body mass index is 29.58 kg/(m^2). GEN: Well nourished, well developed, in no acute distress HEENT: normal Neck: no JVD, carotid bruits, or masses Cardiac: RRR; no murmurs, rubs, or gallops,no edema  Respiratory:  clear to auscultation bilaterally, normal work of breathing GI: soft, nontender, nondistended, + BS MS: no deformity or atrophy Skin: warm and dry, no rash Neuro:  Strength and sensation are intact Psych: normal   EKG:    Recent Labs: 12/22/2015: ALT 21; BUN 11; Creatinine, Ser 1.23; Hemoglobin 17.7*; Platelets 194; Potassium 3.9; Sodium 139    Lipid Panel No results found for: CHOL, TRIG, HDL, CHOLHDL, VLDL, LDLCALC, LDLDIRECT    Wt Readings from Last 3 Encounters:  01/03/16 200 lb 6.4 oz (90.901 kg)  12/23/15 198 lb 8 oz (90.039 kg)  09/12/13 198 lb 12.8 oz (90.175 kg)      Other studies Reviewed: Additional studies/ records that were reviewed today include: . Review of the above records demonstrates:    ASSESSMENT AND PLAN:  1.  Pericarditis:   Symptoms are better.  Still not 100%. Continue colchicine for several weeks.  I'll see him back in 6 weeks for follow-up evaluation.  2. DOE:   Improving . He thinks that he's ready to get back to work. We'll give him a return to work note for tomorrow.  3.  PVC:   Has PVCs on occasion Has them fairly frequent. I've recommended that he drink V8 juice every day to see if this helps.  Current medicines are reviewed at length with the patient today.   The patient does not have concerns regarding medicines.  The following changes have been made:  no change  Labs/ tests ordered today include:  No orders of the defined types were placed in this encounter.     Disposition:   FU with me in 6 weeks.      Kristeen MissPhilip Nahser, MD  01/03/2016 9:37 AM    [facility] [address], [place], [place]  [address] Phone: [phone]([phone]; Fax: [phone]   [facility]  [address] [place], [place]  [address] [phone]   Fax [phone]

## 2016-02-23 ENCOUNTER — Ambulatory Visit: Payer: 59 | Admitting: Cardiovascular Disease

## 2016-02-23 DIAGNOSIS — R0989 Other specified symptoms and signs involving the circulatory and respiratory systems: Secondary | ICD-10-CM

## 2016-03-15 ENCOUNTER — Encounter: Payer: Self-pay | Admitting: Cardiovascular Disease

## 2016-06-14 ENCOUNTER — Encounter: Payer: Self-pay | Admitting: Cardiovascular Disease

## 2016-06-14 ENCOUNTER — Ambulatory Visit (INDEPENDENT_AMBULATORY_CARE_PROVIDER_SITE_OTHER): Payer: 59 | Admitting: Cardiovascular Disease

## 2016-06-14 VITALS — BP 138/92 | HR 80 | Ht 69.0 in | Wt 190.0 lb

## 2016-06-14 DIAGNOSIS — I10 Essential (primary) hypertension: Secondary | ICD-10-CM | POA: Diagnosis not present

## 2016-06-14 DIAGNOSIS — I309 Acute pericarditis, unspecified: Secondary | ICD-10-CM | POA: Diagnosis not present

## 2016-06-14 MED ORDER — LOSARTAN POTASSIUM 50 MG PO TABS: 50.0000 mg | ORAL_TABLET | Freq: Every day | ORAL | 11 refills | Status: DC

## 2016-06-14 NOTE — Patient Instructions (Signed)
Medication Instructions:  START Losartan (Cozaar) 50 mg once daily    Labwork: Your physician recommends that you return for lab work in: 3 weeks for basic metabolic panel   Testing/Procedures: None Ordered   Follow-Up: Your physician wants you to follow-up in: 6 months with Dr. Elease HashimotoNahser.  You will receive a reminder letter in the mail two months in advance. If you don't receive a letter, please call our office to schedule the follow-up appointment.   If you need a refill on your cardiac medications before your next appointment, please call your pharmacy.   Thank you for choosing [facility]! Eligha BridegroomMichelle Swinyer, RN [phone]

## 2016-06-14 NOTE — Progress Notes (Signed)
Cardiology Office Note   Date:  06/14/2016   ID:  Michael Warren, DOB 23-Apr-1971, MRN [id number]  PCP:  Gweneth DimitriMCNEILL,WENDY, MD  Cardiologist:   Kristeen MissPhilip Nahser, MD   Chief Complaint  Patient presents with  . Follow-up    pericarditis   Problem List 1. Pericarditis 2. Dyspnea    Michael Denverrennon Swift is a 45 y.o. male who presents for follow up for his pericarditis.  His pleuretic CP is much better.   Still has DOE.  Is on Mitiare ( brand name for colchicine )   BP has been elevated.     Watches his salt .  Has PVCs On occasion  Dec. 6, 2017:  Michael Warren is doing well . BP readings at home are slighlty elevated on occasion.   At other times, BP is mildly elevated.  Still may eat some extra salt  On occasion  Past Medical History:  Diagnosis Date  . Elevated blood pressure   . Empty sella syndrome (HCC)    partial empty sella syndrome on hormone replacement  . Esophageal ulcer   . Thyroid disease     Past Surgical History:  Procedure Laterality Date  . NO PAST SURGERIES       Current Outpatient Prescriptions  Medication Sig Dispense Refill  . aspirin 325 MG EC tablet Take 650 mg by mouth 2 (two) times daily as needed for pain (or headaches). Reported on 01/03/2016    . B-D 3CC LUER-LOK SYR 21GX1-1/2 21G X 1-1/2" 3 ML MISC AS DIRECTED FOR USE WITH TESTOSTERONE IM  5  . calcium carbonate (TUMS - DOSED IN MG ELEMENTAL CALCIUM) 500 MG chewable tablet Chew 2 tablets by mouth 2 (two) times daily as needed for indigestion or heartburn.    . cetirizine (ZYRTEC) 10 MG tablet Take 10 mg by mouth daily as needed for allergies.    . Cholecalciferol (VITAMIN D3) 5000 UNITS CAPS Take 5,000 Units by mouth daily.    Marland Kitchen. ibuprofen (ADVIL,MOTRIN) 200 MG tablet Take 2 tablets (400 mg total) by mouth 3 (three) times daily. 30 tablet 0  . L-ARGININE PO Take 1 capsule by mouth daily.    Marland Kitchen. levothyroxine (SYNTHROID, LEVOTHROID) 125 MCG tablet Take 125 mcg by mouth daily.    Marland Kitchen. liothyronine (CYTOMEL) 5 MCG  tablet Take 5 mcg by mouth daily.    . Nutritional Supplements (DHEA PO) Take 1 tablet by mouth daily.    Marland Kitchen. omeprazole (PRILOSEC) 40 MG capsule Take 40 mg by mouth daily.    Marland Kitchen. oxymetazoline (AFRIN) 0.05 % nasal spray Place 1 spray into both nostrils daily as needed for congestion.    . sildenafil (REVATIO) 20 MG tablet Take 20 mg by mouth daily as needed (as directed).     Marland Kitchen. testosterone cypionate (DEPOTESTOTERONE CYPIONATE) 200 MG/ML injection Inject 200 mg into the muscle every 14 (fourteen) days.    . VENTOLIN HFA 108 (90 BASE) MCG/ACT inhaler Inhale 2 puffs into the lungs daily as needed for wheezing or shortness of breath.      No current facility-administered medications for this visit.     Allergies:   Patient has no known allergies.    Social History:  The patient  reports that he has never smoked. He does not have any smokeless tobacco history on file. He reports that he drinks alcohol. He reports that he does not use drugs.   Family History:  The patient's family history includes Coronary artery disease in his maternal grandfather and maternal grandmother; Goiter  in his mother.    ROS:  Please see the history of present illness.    Review of Systems: Constitutional:  denies fever, chills, diaphoresis, appetite change and fatigue.  HEENT: denies photophobia, eye pain, redness, hearing loss, ear pain, congestion, sore throat, rhinorrhea, sneezing, neck pain, neck stiffness and tinnitus.  Respiratory: admits to SOB, DOE, cough, chest tightness, and wheezing.  Cardiovascular: denies chest pain, palpitations and leg swelling.  Gastrointestinal: denies nausea, vomiting, abdominal pain, diarrhea, constipation, blood in stool.  Genitourinary: denies dysuria, urgency, frequency, hematuria, flank pain and difficulty urinating.  Musculoskeletal: denies  myalgias, back pain, joint swelling, arthralgias and gait problem.   Skin: denies pallor, rash and wound.  Neurological: denies  dizziness, seizures, syncope, weakness, light-headedness, numbness and headaches.   Hematological: denies adenopathy, easy bruising, personal or family bleeding history.  Psychiatric/ Behavioral: denies suicidal ideation, mood changes, confusion, nervousness, sleep disturbance and agitation.       All other systems are reviewed and negative.    PHYSICAL EXAM: VS:  BP (!) 138/92 (BP Location: Right Arm, Patient Position: Sitting, Cuff Size: Normal)   Pulse 80   Ht 5\' 9"  (1.753 m)   Wt 190 lb (86.2 kg)   BMI 28.06 kg/m  , BMI Body mass index is 28.06 kg/m. GEN: Well nourished, well developed, in no acute distress  HEENT: normal  Neck: no JVD, carotid bruits, or masses Cardiac: RRR; no murmurs, rubs, or gallops,no edema  Respiratory:  clear to auscultation bilaterally, normal work of breathing GI: soft, nontender, nondistended, + BS MS: no deformity or atrophy  Skin: warm and dry, no rash Neuro:  Strength and sensation are intact Psych: normal  EKG:   Recent Labs: 12/22/2015: ALT 21; BUN 11; Creatinine, Ser 1.23; Hemoglobin 17.7; Platelets 194; Potassium 3.9; Sodium 139    Lipid Panel No results found for: CHOL, TRIG, HDL, CHOLHDL, VLDL, LDLCALC, LDLDIRECT    Wt Readings from Last 3 Encounters:  06/14/16 190 lb (86.2 kg)  01/03/16 200 lb 6.4 oz (90.9 kg)  12/23/15 198 lb 8 oz (90 kg)      Other studies Reviewed: Additional studies/ records that were reviewed today include: . Review of the above records demonstrates:    ASSESSMENT AND PLAN:  1.  Pericarditis:   Symptoms are better.  Still not 100%. Has completed the colchicine.  No further symptoms of pericarditis No longer on ASA routinely    2. DOE:   Improving . He thinks that he's ready to get back to work. We'll give him a return to work note for tomorrow.  3.  PVC:   Has PVCs on occasion Has them fairly frequent. I've recommended that he drink V8 juice every day to see if this helps.  Current medicines  are reviewed at length with the patient today.  The patient does not have concerns regarding medicines.  The following changes have been made:  no change  Labs/ tests ordered today include:  No orders of the defined types were placed in this encounter.    Disposition:   FU with me in 6 weeks.      Kristeen MissPhilip Nahser, MD  06/14/2016 9:45 AM    [facility] [address], [place], [place]  [address] Phone: [phone]; Fax: [phone]   [facility]  [address] [place], [place]  [address] [phone]   Fax [phone]

## 2016-07-07 ENCOUNTER — Other Ambulatory Visit: Payer: 59 | Admitting: *Deleted

## 2016-07-07 DIAGNOSIS — I10 Essential (primary) hypertension: Secondary | ICD-10-CM

## 2016-07-07 LAB — BASIC METABOLIC PANEL
BUN: 18 mg/dL (ref 7–25)
CHLORIDE: 100 mmol/L (ref 98–110)
CO2: 28 mmol/L (ref 20–31)
CREATININE: 1.46 mg/dL — AB (ref 0.60–1.35)
Calcium: 9.8 mg/dL (ref 8.6–10.3)
Glucose, Bld: 97 mg/dL (ref 65–99)
POTASSIUM: 3.9 mmol/L (ref 3.5–5.3)
Sodium: 140 mmol/L (ref 135–146)

## 2016-07-12 ENCOUNTER — Other Ambulatory Visit: Payer: Self-pay | Admitting: Nurse Practitioner

## 2016-07-12 DIAGNOSIS — I10 Essential (primary) hypertension: Secondary | ICD-10-CM

## 2016-08-07 DIAGNOSIS — E236 Other disorders of pituitary gland: Secondary | ICD-10-CM | POA: Diagnosis not present

## 2016-08-07 DIAGNOSIS — E230 Hypopituitarism: Secondary | ICD-10-CM | POA: Diagnosis not present

## 2016-08-07 DIAGNOSIS — E063 Autoimmune thyroiditis: Secondary | ICD-10-CM | POA: Diagnosis not present

## 2016-08-14 DIAGNOSIS — E230 Hypopituitarism: Secondary | ICD-10-CM | POA: Diagnosis not present

## 2016-08-14 DIAGNOSIS — E063 Autoimmune thyroiditis: Secondary | ICD-10-CM | POA: Diagnosis not present

## 2016-08-14 DIAGNOSIS — E039 Hypothyroidism, unspecified: Secondary | ICD-10-CM | POA: Diagnosis not present

## 2016-08-14 DIAGNOSIS — Z5181 Encounter for therapeutic drug level monitoring: Secondary | ICD-10-CM | POA: Diagnosis not present

## 2016-08-15 ENCOUNTER — Other Ambulatory Visit: Payer: 59 | Admitting: *Deleted

## 2016-08-15 DIAGNOSIS — I10 Essential (primary) hypertension: Secondary | ICD-10-CM | POA: Diagnosis not present

## 2016-08-16 LAB — BASIC METABOLIC PANEL
BUN / CREAT RATIO: 11 (ref 9–20)
BUN: 14 mg/dL (ref 6–24)
CO2: 29 mmol/L (ref 18–29)
Calcium: 10.6 mg/dL — ABNORMAL HIGH (ref 8.7–10.2)
Chloride: 97 mmol/L (ref 96–106)
Creatinine, Ser: 1.3 mg/dL — ABNORMAL HIGH (ref 0.76–1.27)
GFR calc non Af Amer: 65 mL/min/{1.73_m2} (ref >59)
GFR, EST AFRICAN AMERICAN: 75 mL/min/{1.73_m2} (ref >59)
Glucose: 103 mg/dL — ABNORMAL HIGH (ref 65–99)
Potassium: 4.6 mmol/L (ref 3.5–5.2)
Sodium: 142 mmol/L (ref 134–144)

## 2016-10-17 DIAGNOSIS — R309 Painful micturition, unspecified: Secondary | ICD-10-CM | POA: Diagnosis not present

## 2016-10-23 ENCOUNTER — Ambulatory Visit (INDEPENDENT_AMBULATORY_CARE_PROVIDER_SITE_OTHER): Payer: 59 | Admitting: Urgent Care

## 2016-10-23 VITALS — BP 132/83 | HR 70 | Temp 98.2°F | Ht 69.0 in | Wt 195.6 lb

## 2016-10-23 DIAGNOSIS — J45990 Exercise induced bronchospasm: Secondary | ICD-10-CM | POA: Diagnosis not present

## 2016-10-23 DIAGNOSIS — R05 Cough: Secondary | ICD-10-CM | POA: Diagnosis not present

## 2016-10-23 DIAGNOSIS — Z9109 Other allergy status, other than to drugs and biological substances: Secondary | ICD-10-CM

## 2016-10-23 DIAGNOSIS — R059 Cough, unspecified: Secondary | ICD-10-CM

## 2016-10-23 MED ORDER — AZITHROMYCIN 250 MG PO TABS: ORAL_TABLET | ORAL | 0 refills | Status: DC

## 2016-10-23 MED ORDER — PREDNISONE 20 MG PO TABS: ORAL_TABLET | ORAL | 0 refills | Status: DC

## 2016-10-23 MED ORDER — BENZONATATE 100 MG PO CAPS: 100.0000 mg | ORAL_CAPSULE | Freq: Three times a day (TID) | ORAL | 0 refills | Status: DC | PRN

## 2016-10-23 MED ORDER — HYDROCODONE-HOMATROPINE 5-1.5 MG/5ML PO SYRP: 5.0000 mL | ORAL_SOLUTION | Freq: Every evening | ORAL | 0 refills | Status: DC | PRN

## 2016-10-23 NOTE — Progress Notes (Signed)
  MRN: [id number] DOB: 09-Feb-1971  Subjective:   Michael Warren is a 46 y.o. male presenting for chief complaint of Cough (X 3 with SOB ) and Sinusitis (X 1 mth- pt states that his lungs hurt/fullness)  Reports 3 month history shortness of breath, productive cough, intermittent subjective fever, fatigue. Cough is eliciting chest pain, head pain. Has also had nasal congestion, sinus pain. Has had a history exercise induced asthma, using albuterol inhaler daily. Has allergies, managed with Zyrtec but has not taken this consistently. Denies dizziness, ear pain, sore throat, wheezing, n/v, abdominal pain, rashes. Denies smoking cigarettes.  Michael Warren has a current medication list which includes the following prescription(s): aspirin, b-d 3cc luer-lok syr 21gx1-1/2, calcium carbonate, cetirizine, vitamin d3, ibuprofen, arginine, levothyroxine, liothyronine, nutritional supplements, omeprazole, oxymetazoline, sildenafil, testosterone cypionate, ventolin hfa, and losartan. Also has No Known Allergies. Michael Warren  has a past medical history of Elevated blood pressure; Empty sella syndrome (HCC); Esophageal ulcer; and Thyroid disease. Also  has a past surgical history that includes No past surgeries. His family history includes Coronary artery disease in his maternal grandfather and maternal grandmother; Goiter in his mother.   Objective:   Vitals: BP 132/83 (BP Location: Right Arm, Patient Position: Sitting, Cuff Size: Large)   Pulse 70   Temp 98.2 F (36.8 C) (Oral)   Ht  (1.753 m)   Wt 195 lb 9.6 oz (88.7 kg)   SpO2 98%   BMI 28.89 kg/m   Physical Exam  Constitutional: He is oriented to person, place, and time. He appears well-developed and well-nourished.  HENT:  TM's intact bilaterally, no effusions or erythema. Nasal turbinates pink and moist, nasal passages patent. No sinus tenderness. Throat with erythema, thick post-nasal drainage, mucous membranes moist, dentition in good repair.  Eyes:  Right eye exhibits no discharge. Left eye exhibits no discharge.  Neck: Normal range of motion. Neck supple.  Cardiovascular: Normal rate, regular rhythm and intact distal pulses.  Exam reveals no gallop and no friction rub.   No murmur heard. Pulmonary/Chest: No respiratory distress. He has no wheezes. He has no rales.  Lymphadenopathy:    He has no cervical adenopathy.  Neurological: He is alert and oriented to person, place, and time.  Skin: Skin is warm and dry.   Assessment and Plan :   1. Cough - Will cover for bronchitis with azithromycin, use cough suppression medications. RTC in 1 week if no improvement.  2. Exercise-induced asthma - Schedule albuterol inhaler, use steroid taper.   3. Environmental allergies - Restart allergy medications, use decongestant Sudafed.  Wallis Bamberg, PA-C [facility] [phone] 10/23/2016  10:55 AM

## 2016-10-23 NOTE — Patient Instructions (Addendum)
Start Zyrtec daily and use Sudafed up to twice a day as needed. Take Zyrtec through the end of April to address allergic rhinitis, post-nasal drainage. Schedule your albuterol inhaler 2-3 times per day for the rest of this week. If your symptoms do not improve despite a steroid and antibiotic course, then come back and we will likely pursue a chest x-ray or different antibiotic course but make sure you are taking your allergy medications daily.     Cough, Adult Coughing is a reflex that clears your throat and your airways. Coughing helps to heal and protect your lungs. It is normal to cough occasionally, but a cough that happens with other symptoms or lasts a long time may be a sign of a condition that needs treatment. A cough may last only 2-3 weeks (acute), or it may last longer than 8 weeks (chronic). What are the causes? Coughing is commonly caused by:  Breathing in substances that irritate your lungs.  A viral or bacterial respiratory infection.  Allergies.  Asthma.  Postnasal drip.  Smoking.  Acid backing up from the stomach into the esophagus (gastroesophageal reflux).  Certain medicines.  Chronic lung problems, including COPD (or rarely, lung cancer).  Other medical conditions such as heart failure. Follow these instructions at home: Pay attention to any changes in your symptoms. Take these actions to help with your discomfort:  Take medicines only as told by your health care provider.  If you were prescribed an antibiotic medicine, take it as told by your health care provider. Do not stop taking the antibiotic even if you start to feel better.  Talk with your health care provider before you take a cough suppressant medicine.  Drink enough fluid to keep your urine clear or pale yellow.  If the air is dry, use a cold steam vaporizer or humidifier in your bedroom or your home to help loosen secretions.  Avoid anything that causes you to cough at work or at home.  If  your cough is worse at night, try sleeping in a semi-upright position.  Avoid cigarette smoke. If you smoke, quit smoking. If you need help quitting, ask your health care provider.  Avoid caffeine.  Avoid alcohol.  Rest as needed. Contact a health care provider if:  You have new symptoms.  You cough up pus.  Your cough does not get better after 2-3 weeks, or your cough gets worse.  You cannot control your cough with suppressant medicines and you are losing sleep.  You develop pain that is getting worse or pain that is not controlled with pain medicines.  You have a fever.  You have unexplained weight loss.  You have night sweats. Get help right away if:  You cough up blood.  You have difficulty breathing.  Your heartbeat is very fast. This information is not intended to replace advice given to you by your health care provider. Make sure you discuss any questions you have with your health care provider. Document Released: 12/23/2010 Document Revised: 12/02/2015 Document Reviewed: 09/02/2014 Elsevier Interactive Patient Education  2017 [employer].     IF you received an x-ray today, you will receive an invoice from [facility]. Please contact [facility] at [phone] with questions or concerns regarding your invoice.   IF you received labwork today, you will receive an invoice from [facility]. Please contact [facility] at [phone] with questions or concerns regarding your invoice.   Our billing staff will not be able to assist you with questions regarding bills  from these companies.  You will be contacted with the lab results as soon as they are available. The fastest way to get your results is to activate your My Chart account. Instructions are located on the last page of this paperwork. If you have not heard from [place] regarding the results in 2 weeks, please contact this office.

## 2016-11-09 DIAGNOSIS — K219 Gastro-esophageal reflux disease without esophagitis: Secondary | ICD-10-CM | POA: Diagnosis not present

## 2016-11-09 DIAGNOSIS — Z0000 Encounter for general adult medical examination without abnormal findings: Secondary | ICD-10-CM | POA: Diagnosis not present

## 2016-11-09 DIAGNOSIS — I10 Essential (primary) hypertension: Secondary | ICD-10-CM | POA: Diagnosis not present

## 2016-11-09 DIAGNOSIS — J4530 Mild persistent asthma, uncomplicated: Secondary | ICD-10-CM | POA: Diagnosis not present

## 2017-02-12 DIAGNOSIS — E063 Autoimmune thyroiditis: Secondary | ICD-10-CM | POA: Diagnosis not present

## 2017-02-12 DIAGNOSIS — E230 Hypopituitarism: Secondary | ICD-10-CM | POA: Diagnosis not present

## 2017-02-12 DIAGNOSIS — Z5181 Encounter for therapeutic drug level monitoring: Secondary | ICD-10-CM | POA: Diagnosis not present

## 2017-02-20 DIAGNOSIS — I10 Essential (primary) hypertension: Secondary | ICD-10-CM | POA: Diagnosis not present

## 2017-02-20 DIAGNOSIS — N183 Chronic kidney disease, stage 3 (moderate): Secondary | ICD-10-CM | POA: Diagnosis not present

## 2017-02-21 ENCOUNTER — Other Ambulatory Visit: Payer: Self-pay | Admitting: Family Medicine

## 2017-02-21 DIAGNOSIS — R7989 Other specified abnormal findings of blood chemistry: Secondary | ICD-10-CM

## 2017-02-21 DIAGNOSIS — N183 Chronic kidney disease, stage 3 unspecified: Secondary | ICD-10-CM

## 2017-03-08 DIAGNOSIS — E236 Other disorders of pituitary gland: Secondary | ICD-10-CM | POA: Diagnosis not present

## 2017-03-08 DIAGNOSIS — E063 Autoimmune thyroiditis: Secondary | ICD-10-CM | POA: Diagnosis not present

## 2017-03-08 DIAGNOSIS — E230 Hypopituitarism: Secondary | ICD-10-CM | POA: Diagnosis not present

## 2017-03-13 ENCOUNTER — Ambulatory Visit
Admission: RE | Admit: 2017-03-13 | Discharge: 2017-03-13 | Disposition: A | Discharge status: Home / Self Care | Payer: 59 | Source: Ambulatory Visit | Attending: Family Medicine | Admitting: Family Medicine

## 2017-03-13 DIAGNOSIS — R7989 Other specified abnormal findings of blood chemistry: Secondary | ICD-10-CM

## 2017-03-13 DIAGNOSIS — N183 Chronic kidney disease, stage 3 unspecified: Secondary | ICD-10-CM

## 2017-07-08 DIAGNOSIS — S61412A Laceration without foreign body of left hand, initial encounter: Secondary | ICD-10-CM | POA: Diagnosis not present

## 2017-09-05 DIAGNOSIS — E230 Hypopituitarism: Secondary | ICD-10-CM | POA: Diagnosis not present

## 2017-09-05 DIAGNOSIS — Z79899 Other long term (current) drug therapy: Secondary | ICD-10-CM | POA: Diagnosis not present

## 2017-09-05 DIAGNOSIS — E039 Hypothyroidism, unspecified: Secondary | ICD-10-CM | POA: Diagnosis not present

## 2017-11-12 DIAGNOSIS — I10 Essential (primary) hypertension: Secondary | ICD-10-CM | POA: Diagnosis not present

## 2017-11-12 DIAGNOSIS — J4530 Mild persistent asthma, uncomplicated: Secondary | ICD-10-CM | POA: Diagnosis not present

## 2017-11-12 DIAGNOSIS — K219 Gastro-esophageal reflux disease without esophagitis: Secondary | ICD-10-CM | POA: Diagnosis not present

## 2017-11-12 DIAGNOSIS — Z0000 Encounter for general adult medical examination without abnormal findings: Secondary | ICD-10-CM | POA: Diagnosis not present

## 2017-11-27 DIAGNOSIS — E039 Hypothyroidism, unspecified: Secondary | ICD-10-CM | POA: Diagnosis not present

## 2017-11-29 DIAGNOSIS — E230 Hypopituitarism: Secondary | ICD-10-CM | POA: Diagnosis not present

## 2017-11-29 DIAGNOSIS — E236 Other disorders of pituitary gland: Secondary | ICD-10-CM | POA: Diagnosis not present

## 2017-11-29 DIAGNOSIS — E063 Autoimmune thyroiditis: Secondary | ICD-10-CM | POA: Diagnosis not present

## 2018-01-16 DIAGNOSIS — I10 Essential (primary) hypertension: Secondary | ICD-10-CM | POA: Diagnosis not present

## 2018-01-16 DIAGNOSIS — J4530 Mild persistent asthma, uncomplicated: Secondary | ICD-10-CM | POA: Diagnosis not present

## 2018-01-16 DIAGNOSIS — Z23 Encounter for immunization: Secondary | ICD-10-CM | POA: Diagnosis not present

## 2018-01-16 DIAGNOSIS — K219 Gastro-esophageal reflux disease without esophagitis: Secondary | ICD-10-CM | POA: Diagnosis not present

## 2018-02-25 DIAGNOSIS — R198 Other specified symptoms and signs involving the digestive system and abdomen: Secondary | ICD-10-CM | POA: Diagnosis not present

## 2018-02-25 DIAGNOSIS — R195 Other fecal abnormalities: Secondary | ICD-10-CM | POA: Diagnosis not present

## 2018-02-27 DIAGNOSIS — R195 Other fecal abnormalities: Secondary | ICD-10-CM | POA: Diagnosis not present

## 2018-03-07 DIAGNOSIS — M545 Low back pain: Secondary | ICD-10-CM | POA: Diagnosis not present

## 2018-03-15 DIAGNOSIS — M545 Low back pain: Secondary | ICD-10-CM | POA: Diagnosis not present

## 2018-03-20 DIAGNOSIS — R197 Diarrhea, unspecified: Secondary | ICD-10-CM | POA: Diagnosis not present

## 2018-03-20 DIAGNOSIS — K921 Melena: Secondary | ICD-10-CM | POA: Diagnosis not present

## 2018-03-20 DIAGNOSIS — K625 Hemorrhage of anus and rectum: Secondary | ICD-10-CM | POA: Diagnosis not present

## 2018-03-26 DIAGNOSIS — E230 Hypopituitarism: Secondary | ICD-10-CM | POA: Diagnosis not present

## 2018-03-26 DIAGNOSIS — Z5181 Encounter for therapeutic drug level monitoring: Secondary | ICD-10-CM | POA: Diagnosis not present

## 2018-03-26 DIAGNOSIS — E236 Other disorders of pituitary gland: Secondary | ICD-10-CM | POA: Diagnosis not present

## 2018-04-03 DIAGNOSIS — R5383 Other fatigue: Secondary | ICD-10-CM | POA: Diagnosis not present

## 2018-04-23 DIAGNOSIS — Z1211 Encounter for screening for malignant neoplasm of colon: Secondary | ICD-10-CM | POA: Diagnosis not present

## 2018-04-23 DIAGNOSIS — K625 Hemorrhage of anus and rectum: Secondary | ICD-10-CM | POA: Diagnosis not present

## 2018-04-23 DIAGNOSIS — Z8719 Personal history of other diseases of the digestive system: Secondary | ICD-10-CM | POA: Diagnosis not present

## 2018-04-23 DIAGNOSIS — K648 Other hemorrhoids: Secondary | ICD-10-CM | POA: Diagnosis not present

## 2018-05-13 DIAGNOSIS — B349 Viral infection, unspecified: Secondary | ICD-10-CM | POA: Diagnosis not present

## 2018-05-21 DIAGNOSIS — B029 Zoster without complications: Secondary | ICD-10-CM | POA: Diagnosis not present

## 2018-05-27 DIAGNOSIS — E230 Hypopituitarism: Secondary | ICD-10-CM | POA: Diagnosis not present

## 2018-08-12 DIAGNOSIS — M545 Low back pain: Secondary | ICD-10-CM | POA: Diagnosis not present

## 2018-08-21 DIAGNOSIS — K648 Other hemorrhoids: Secondary | ICD-10-CM | POA: Insufficient documentation

## 2018-08-21 HISTORY — DX: Other hemorrhoids: K64.8

## 2018-11-19 DIAGNOSIS — I10 Essential (primary) hypertension: Secondary | ICD-10-CM | POA: Diagnosis not present

## 2018-11-19 DIAGNOSIS — Z0001 Encounter for general adult medical examination with abnormal findings: Secondary | ICD-10-CM | POA: Diagnosis not present

## 2018-11-19 DIAGNOSIS — K219 Gastro-esophageal reflux disease without esophagitis: Secondary | ICD-10-CM | POA: Diagnosis not present

## 2018-11-19 DIAGNOSIS — J4530 Mild persistent asthma, uncomplicated: Secondary | ICD-10-CM | POA: Diagnosis not present

## 2018-11-25 DIAGNOSIS — E230 Hypopituitarism: Secondary | ICD-10-CM | POA: Diagnosis not present

## 2018-11-25 DIAGNOSIS — E236 Other disorders of pituitary gland: Secondary | ICD-10-CM | POA: Diagnosis not present

## 2018-11-25 DIAGNOSIS — E063 Autoimmune thyroiditis: Secondary | ICD-10-CM | POA: Diagnosis not present

## 2018-11-25 DIAGNOSIS — I10 Essential (primary) hypertension: Secondary | ICD-10-CM | POA: Diagnosis not present

## 2018-11-27 DIAGNOSIS — E236 Other disorders of pituitary gland: Secondary | ICD-10-CM | POA: Diagnosis not present

## 2018-11-27 DIAGNOSIS — E063 Autoimmune thyroiditis: Secondary | ICD-10-CM | POA: Diagnosis not present

## 2018-11-27 DIAGNOSIS — E230 Hypopituitarism: Secondary | ICD-10-CM | POA: Diagnosis not present

## 2019-03-10 ENCOUNTER — Telehealth: Payer: Self-pay | Admitting: Cardiovascular Disease

## 2019-03-10 NOTE — Telephone Encounter (Signed)

## 2019-03-11 ENCOUNTER — Encounter: Payer: Self-pay | Admitting: Cardiovascular Disease

## 2019-03-11 ENCOUNTER — Telehealth (INDEPENDENT_AMBULATORY_CARE_PROVIDER_SITE_OTHER): Payer: 59 | Admitting: Cardiovascular Disease

## 2019-03-11 ENCOUNTER — Other Ambulatory Visit: Payer: Self-pay

## 2019-03-11 VITALS — BP 135/82 | HR 82 | Ht 69.0 in | Wt 196.0 lb

## 2019-03-11 DIAGNOSIS — I10 Essential (primary) hypertension: Secondary | ICD-10-CM

## 2019-03-11 DIAGNOSIS — I309 Acute pericarditis, unspecified: Secondary | ICD-10-CM | POA: Diagnosis not present

## 2019-03-11 NOTE — Patient Instructions (Addendum)
Medication Instructions:  1) DISCONTINUE Aspirin  If you need a refill on your cardiac medications before your next appointment, please call your pharmacy.   Lab work: None If you have labs (blood work) drawn today and your tests are completely normal, you will receive your results only by: Marland Kitchen MyChart Message (if you have MyChart) OR . A paper copy in the mail If you have any lab test that is abnormal or we need to change your treatment, we will call you to review the results.  Testing/Procedures: None  Follow-Up: At [facility], you and your health needs are our priority.  As part of our continuing mission to provide you with exceptional heart care, we have created designated Provider Care Teams.  These Care Teams include your primary Cardiologist (physician) and Advanced Practice Providers (APPs -  Physician Assistants and Nurse Practitioners) who all work together to provide you with the care you need, when you need it. You will need a follow up appointment in:  12 months.  Please call our office 2 months in advance to schedule this appointment.  You may see Dr. Acie Fredrickson or one of the following Advanced Practice Providers on your designated Care Team: Richardson Dopp, PA-C [place], [place] . Daune Perch, NP  Any Other Special Instructions Will Be Listed Below (If Applicable).

## 2019-03-11 NOTE — Progress Notes (Signed)
Virtual Visit via Video Note   This visit type was conducted due to national recommendations for restrictions regarding the COVID-19 Pandemic (e.g. social distancing) in an effort to limit this patient's exposure and mitigate transmission in our community.  Due to his co-morbid illnesses, this patient is at least at moderate risk for complications without adequate follow up.  This format is felt to be most appropriate for this patient at this time.  All issues noted in this document were discussed and addressed.  A limited physical exam was performed with this format.  Please refer to the patient's chart for his consent to telehealth for [facility].   Date:  03/11/2019   ID:  [name], DOB 04-18-1971, MRN [id number]  Patient Location: Home Provider Location: Office  PCP:  Cari Caraway, MD  Cardiologist:  David Rodriquez  Electrophysiologist:  None   Problem List 1. Pericarditis 2. Dyspnea    Michael Warren is a 48 y.o. male who presents for follow up for his pericarditis.  His pleuretic CP is much better.   Still has DOE.  Is on Mitiare ( brand name for colchicine )   BP has been elevated.     Watches his salt .  Has PVCs On occasion  Dec. 6, 2017:  Michael Warren is doing well . BP readings at home are slighlty elevated on occasion.   At other times, BP is mildly elevated.  Still may eat some extra salt  On occasion   Evaluation Performed:  Follow-Up Visit  Chief Complaint:  Follow up hx of pericarditis,   Sept. 1, 2020   Michael Warren is a 48 y.o. male with  Hx of pericarditis. Also has had some HTN issues BP has been going up recently  Dr Addison Lank started Losartan ( upset his stomach)  Started amlodipine 5 mg a day ,   BP was still elevated.  Now is on Amlodipine 10 mg a day  Was stared on HCTZ 12.5 mg  A day  ( hasn't started it yet)  Avoids salt for the most part  Is tolerating the amlodipine fairly well  Exercising regularly ( 2 miles a day at lunch )     The  patient does not have symptoms concerning for COVID-19 infection (fever, chills, cough, or new shortness of breath).    Past Medical History:  Diagnosis Date  . Elevated blood pressure   . Empty sella syndrome ([place])    partial empty sella syndrome on hormone replacement  . Esophageal ulcer   . Thyroid disease    Past Surgical History:  Procedure Laterality Date  . NO PAST SURGERIES       Current Meds  Medication Sig  . amLODipine (NORVASC) 10 MG tablet Take 10 mg by mouth daily.  . B-D 3CC LUER-LOK SYR 21GX1-1/2 21G X 1-1/2" 3 ML MISC AS DIRECTED FOR USE WITH TESTOSTERONE IM  . calcium carbonate (TUMS - DOSED IN MG ELEMENTAL CALCIUM) 500 MG chewable tablet Chew 2 tablets by mouth 2 (two) times daily as needed for indigestion or heartburn.  . cetirizine (ZYRTEC) 10 MG tablet Take 10 mg by mouth daily as needed for allergies.  . Cholecalciferol (VITAMIN D3) 5000 UNITS CAPS Take 5,000 Units by mouth daily.  . hydrochlorothiazide (MICROZIDE) 12.5 MG capsule Take 12.5 mg by mouth daily.  Marland Kitchen ibuprofen (ADVIL,MOTRIN) 200 MG tablet Take 2 tablets (400 mg total) by mouth 3 (three) times daily.  Marland Kitchen levothyroxine (SYNTHROID, LEVOTHROID) 125 MCG tablet Take 125 mcg by mouth  daily.  . liothyronine (CYTOMEL) 5 MCG tablet Take 5 mcg by mouth daily.  Marland Kitchen omeprazole (PRILOSEC) 40 MG capsule Take 40 mg by mouth daily.  Marland Kitchen oxymetazoline (AFRIN) 0.05 % nasal spray Place 1 spray into both nostrils daily as needed for congestion.  . tadalafil (CIALIS) 20 MG tablet Take 20 mg by mouth daily as needed for erectile dysfunction.  Marland Kitchen testosterone cypionate (DEPOTESTOTERONE CYPIONATE) 200 MG/ML injection Inject 200 mg into the muscle every 14 (fourteen) days.  . VENTOLIN HFA 108 (90 BASE) MCG/ACT inhaler Inhale 2 puffs into the lungs daily as needed for wheezing or shortness of breath.   . [DISCONTINUED] aspirin 325 MG EC tablet Take 650 mg by mouth 2 (two) times daily as needed for pain (or headaches). Reported on  01/03/2016  . [DISCONTINUED] sildenafil (REVATIO) 20 MG tablet Take 20 mg by mouth daily as needed (as directed).      Allergies:   Patient has no known allergies.   Social History   Tobacco Use  . Smoking status: Never Smoker  . Smokeless tobacco: Former Neurosurgeon    Types: Snuff  Substance Use Topics  . Alcohol use: Yes    Alcohol/week: 3.0 standard drinks    Types: 1 Glasses of wine, 1 Cans of beer, 1 Shots of liquor per week  . Drug use: No     Family Hx: The patient's family history includes Coronary artery disease in his maternal grandfather and maternal grandmother; Goiter in his mother.  ROS:   Please see the history of present illness.     All other systems reviewed and are negative.   Prior CV studies:   The following studies were reviewed today:    Labs/Other Tests and Data Reviewed:    EKG:  No ECG reviewed.  Recent Labs: No results found for requested labs within last 8760 hours.   Recent Lipid Panel No results found for: CHOL, TRIG, HDL, CHOLHDL, LDLCALC, LDLDIRECT  Wt Readings from Last 3 Encounters:  03/11/19 196 lb (88.9 kg)  10/23/16 195 lb 9.6 oz (88.7 kg)  06/14/16 190 lb (86.2 kg)     Objective:    Vital Signs:  BP 135/82   Pulse 82   Ht 5\' 9"  (1.753 m)   Wt 196 lb (88.9 kg)   BMI 28.94 kg/m    VITAL SIGNS:  reviewed GEN:  no acute distress EYES:  sclerae anicteric, EOMI - Extraocular Movements Intact RESPIRATORY:  normal respiratory effort, symmetric expansion CARDIOVASCULAR:  no peripheral edema SKIN:  no rash, lesions or ulcers. MUSCULOSKELETAL:  no obvious deformities. NEURO:  alert and oriented x 3, no obvious focal deficit PSYCH:  normal affect  ASSESSMENT & PLAN:    1. HTN:  He is doing well on amlodipine 10 mg.  Dr. Uvaldo Rising has added hydrochlorothiazide.  I suspect that he may be able to decrease his amlodipine down to 5 mg a day with the addition of the HCTZ.  He may also need a potassium supplement.  I will let Dr. Uvaldo Rising  continue to manage this.    2.  History of pericarditis: He is not having any recurrent episodes of pericarditis.  We will discontinue his high-dose aspirin.  COVID-19 Education: The signs and symptoms of COVID-19 were discussed with the patient and how to seek care for testing (follow up with PCP or arrange E-visit).  The importance of social distancing was discussed today.  Time:   Today, I have spent  18 minutes with the patient with  telehealth technology discussing the above problems.     Medication Adjustments/Labs and Tests Ordered: Current medicines are reviewed at length with the patient today.  Concerns regarding medicines are outlined above.   Tests Ordered: No orders of the defined types were placed in this encounter.   Medication Changes: No orders of the defined types were placed in this encounter.   Follow Up:  In Person in 1 year(s)  Signed, Kristeen MissPhilip Hutchinson Isenberg, MD  03/11/2019 9:56 AM     [facility]

## 2019-04-16 DIAGNOSIS — E039 Hypothyroidism, unspecified: Secondary | ICD-10-CM | POA: Insufficient documentation

## 2019-04-16 HISTORY — DX: Hypothyroidism, unspecified: E03.9

## 2019-07-17 ENCOUNTER — Other Ambulatory Visit: Payer: Self-pay | Admitting: Ophthalmology

## 2019-07-17 DIAGNOSIS — S0540XA Penetrating wound of orbit with or without foreign body, unspecified eye, initial encounter: Secondary | ICD-10-CM

## 2019-07-18 ENCOUNTER — Other Ambulatory Visit: Payer: Self-pay | Admitting: Optometry

## 2019-07-18 ENCOUNTER — Other Ambulatory Visit: Payer: Self-pay

## 2019-07-18 ENCOUNTER — Ambulatory Visit
Admission: RE | Admit: 2019-07-18 | Discharge: 2019-07-18 | Disposition: A | Discharge status: Home / Self Care | Payer: Self-pay | Source: Ambulatory Visit | Attending: Optometry | Admitting: Optometry

## 2019-07-18 ENCOUNTER — Other Ambulatory Visit: Payer: Self-pay | Admitting: Surgery

## 2019-07-18 DIAGNOSIS — T1592XA Foreign body on external eye, part unspecified, left eye, initial encounter: Secondary | ICD-10-CM

## 2019-07-21 ENCOUNTER — Other Ambulatory Visit: Payer: Self-pay | Admitting: Ophthalmology

## 2019-07-21 ENCOUNTER — Other Ambulatory Visit: Payer: Self-pay | Admitting: Optometry

## 2019-07-21 DIAGNOSIS — Z77018 Contact with and (suspected) exposure to other hazardous metals: Secondary | ICD-10-CM

## 2019-08-01 ENCOUNTER — Ambulatory Visit
Admission: RE | Admit: 2019-08-01 | Discharge: 2019-08-01 | Disposition: A | Discharge status: Home / Self Care | Payer: No Typology Code available for payment source | Source: Ambulatory Visit | Attending: Optometry | Admitting: Optometry

## 2019-08-01 DIAGNOSIS — Z77018 Contact with and (suspected) exposure to other hazardous metals: Secondary | ICD-10-CM

## 2019-08-11 DIAGNOSIS — Z8616 Personal history of COVID-19: Secondary | ICD-10-CM | POA: Insufficient documentation

## 2019-08-11 HISTORY — DX: Personal history of COVID-19: Z86.16

## 2019-09-09 ENCOUNTER — Telehealth: Payer: Self-pay | Admitting: Internal Medicine

## 2019-09-09 NOTE — Telephone Encounter (Signed)
Called to discuss with [place] about Covid symptoms and the use of bamlanivimab, a monoclonal antibody infusion for those with mild to moderate Covid symptoms and at a high risk of hospitalization. Pt was referred to infusion clinic by PCP  Pt is not qualified for this infusion due to lack of identified risk factors and co-morbid conditions.Symptoms reviewed that would warrant ED/Hospital evaluation as well should her condition worsen. Preventative practices reviewed. Patient verbalized understanding.   Patient Active Problem List   Diagnosis Date Noted  . Acute pericarditis   . Globus sensation   . Essential hypertension   . Empty sella syndrome (HCC)   . Chest pain with low risk for cardiac etiology 12/22/2015   Cyndee Brightly, NP-C [facility]

## 2019-10-23 ENCOUNTER — Telehealth: Payer: Self-pay | Admitting: Cardiovascular Disease

## 2019-10-23 MED ORDER — METOPROLOL TARTRATE 25 MG PO TABS: 25.0000 mg | ORAL_TABLET | Freq: Two times a day (BID) | ORAL | 11 refills | Status: DC

## 2019-10-23 NOTE — Telephone Encounter (Signed)
Spoke with patient who states his resting heart rate has been > 100 bpm since having Covid infection February 24. 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  Using albuterol almost daily for SOB. States he is able to take short walks but gets muscle fatigue even with short distances. He was previously very active with cardio and weight lifting exercises daily.  States woke up during the night on one occasion with his heart pounding and sweating. Past hx of pericarditis; states he is unsure as to whether this feels like the symptoms he had previously. He is able to lie flat and denies chest discomfort.  Currently BP is 140/81, 82 pulse. I discussed with Dr. Elease Hashimoto and he advise that patient start metoprolol tartrate 25 mg twice daily. I scheduled him for an office visit on Thursday 4/22 and advised him to call back prior to that date with questions or concerns. I advised that if his SOB worsens or if he develops chest pain to go to ED. He verbalized understanding and agreement with plan and thanked me for the call.

## 2019-10-23 NOTE — Telephone Encounter (Signed)
STAT if HR is under 50 or over 120 (normal HR is 60-100 beats per minute)  1) What is your heart rate? 110  2) Do you have a log of your heart rate readings (document readings)? Around 100, 126 yesterday  3) Do you have any other symptoms? SOB when moving and sitting, sweating, nervousness, weak   Patient states his HR has been very high and has SOB since he had covid back in February. He states yesterday it went up to 126 while getting ready for work.

## 2019-10-29 ENCOUNTER — Encounter: Payer: Self-pay | Admitting: Cardiovascular Disease

## 2019-10-29 NOTE — Progress Notes (Signed)
Cardiology Office Note   Date:  10/30/2019   ID:  Michael Warren, DOB 07-Feb-1971, MRN [id number]  PCP:  Gweneth Dimitri, MD  Cardiologist:   Kristeen Miss, MD   Chief Complaint  Patient presents with  . Hypertension   Problem List 1. Pericarditis 2. Dyspnea    Michael Warren is a 49 y.o. male who presents for follow up for his pericarditis.  His pleuretic CP is much better.   Still has DOE.  Is on Mitiare ( brand name for colchicine )   BP has been elevated.     Watches his salt .  Has PVCs On occasion  Dec. 6, 2017:  Michael Warren is doing well . BP readings at home are slighlty elevated on occasion.   At other times, BP is mildly elevated.  Still may eat some extra salt  On occasion   October 30, 2019:  Michael Warren is seen today for follow-up visit. He has a history of pericarditis from several years ago.  He has a history of Covid infection in February, 2021.  He is noticed that his heart rate is beating faster since that time.  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  He has had increasing shortness of breath especially with ambulation.   The episode started at night.  Work him up. HR was 118.  Was very sweaty, Could not catch her breath  We started metoprolol 25 BID    HR is lower . Still is fatigued,   Getting over Covid 2 months ago  Tries to walk, limited by dyspnea and muscle fatigue   We started him on metoprolol 25 mg twice a day and schedule him to come see me for further evaluation. No pleuretic CP .     TSH is normal on October 16, 2019 Has not been able to return to work due to this fatigue and muscle soreness.  Past Medical History:  Diagnosis Date  . Elevated blood pressure   . Empty sella syndrome (HCC)    partial empty sella syndrome on hormone replacement  . Esophageal ulcer   . Thyroid disease     Past Surgical History:  Procedure Laterality Date  . NO PAST SURGERIES       Current Outpatient Medications    Medication Sig Dispense Refill  . B-D 3CC LUER-LOK SYR 21GX1-1/2 21G X 1-1/2" 3 ML MISC AS DIRECTED FOR USE WITH TESTOSTERONE IM  5  . calcium carbonate (TUMS - DOSED IN MG ELEMENTAL CALCIUM) 500 MG chewable tablet Chew 2 tablets by mouth 2 (two) times daily as needed for indigestion or heartburn.    . cetirizine (ZYRTEC) 10 MG tablet Take 10 mg by mouth daily as needed for allergies.    . Cholecalciferol (VITAMIN D3) 5000 UNITS CAPS Take 5,000 Units by mouth daily.    Marland Kitchen FLOVENT HFA 110 MCG/ACT inhaler Inhale 2 puffs into the lungs 2 (two) times daily.    . hydrochlorothiazide (MICROZIDE) 12.5 MG capsule Take 12.5 mg by mouth daily.    Marland Kitchen ibuprofen (ADVIL,MOTRIN) 200 MG tablet Take 2 tablets (400 mg total) by mouth 3 (three) times daily. 30 tablet 0  . levothyroxine (SYNTHROID, LEVOTHROID) 125 MCG tablet Take 125 mcg by mouth daily.    Marland Kitchen liothyronine (CYTOMEL) 5 MCG tablet Take 5 mcg by mouth daily.    . metoprolol tartrate (LOPRESSOR) 25 MG tablet Take 1 tablet (25 mg total) by mouth 2 (two) times daily. 180 tablet  3  . omeprazole (PRILOSEC) 40 MG capsule Take 40 mg by mouth daily.    Marland Kitchen oxymetazoline (AFRIN) 0.05 % nasal spray Place 1 spray into both nostrils daily as needed for congestion.    . tadalafil (CIALIS) 20 MG tablet Take 20 mg by mouth daily as needed for erectile dysfunction.    Marland Kitchen testosterone cypionate (DEPOTESTOTERONE CYPIONATE) 200 MG/ML injection Inject 200 mg into the muscle every 14 (fourteen) days.    . valACYclovir (VALTREX) 1000 MG tablet Take 1 tablet by mouth as needed.    . VENTOLIN HFA 108 (90 BASE) MCG/ACT inhaler Inhale 2 puffs into the lungs daily as needed for wheezing or shortness of breath.     Marland Kitchen amLODipine (NORVASC) 5 MG tablet Take 1 tablet (5 mg total) by mouth daily. 30 tablet 11  . valsartan (DIOVAN) 160 MG tablet Take 1 tablet (160 mg total) by mouth daily. 30 tablet 11   No current facility-administered medications for this visit.    Allergies:   Patient  has no known allergies.    Social History:  The patient  reports that he has never smoked. He quit smokeless tobacco use about 4 years ago.  His smokeless tobacco use included snuff. He reports current alcohol use of about 3.0 standard drinks of alcohol per week. He reports that he does not use drugs.   Family History:  The patient's family history includes Coronary artery disease in his maternal grandfather and maternal grandmother; Goiter in his mother.    ROS:  Please see the history of present illness.     PHYSICAL EXAM:  Physical Exam: Blood pressure (!) 126/92, pulse 68, height 5\' 9"  (1.753 m), weight 192 lb (87.1 kg), SpO2 95 %.  GEN:  Well nourished, well developed in no acute distress HEENT: Normal NECK: No JVD; No carotid bruits LYMPHATICS: No lymphadenopathy CARDIAC: RRR , no murmurs, rubs, gallops RESPIRATORY:  Clear to auscultation without rales, wheezing or rhonchi  ABDOMEN: Soft, non-tender, non-distended MUSCULOSKELETAL:  No edema; No deformity  SKIN: Warm and dry NEUROLOGIC:  Alert and oriented x 3   EKG:   October 30, 2019: Normal sinus rhythm at 69.  No ST or T wave changes.  Recent Labs: No results found for requested labs within last 8760 hours.    Lipid Panel No results found for: CHOL, TRIG, HDL, CHOLHDL, VLDL, LDLCALC, LDLDIRECT    Wt Readings from Last 3 Encounters:  10/30/19 192 lb (87.1 kg)  03/11/19 196 lb (88.9 kg)  10/23/16 195 lb 9.6 oz (88.7 kg)      Other studies Reviewed: Additional studies/ records that were reviewed today include: . Review of the above records demonstrates:    ASSESSMENT AND PLAN:  1.  Pericarditis:    No evidence of recurrent pericarditis   2.  HTN: diastolic BP is elevated.    Will reduce amlodipine down to 5 mg a day.  We will add valsartan 160 mg a day. Plan return in 3 weeks  Nurse visit blood pressure check as well as a basic metabolic profile.  Anticipate increasing his HCTZ at that time.  I hope to  wean him off the amlodipine completely at some point. He still eats some salt.  I have asked him to watch his salt intake.  3.  Recent Covid infection: He still has lots of fatigue and shortness of breath following Covid infection.  Some this might just be due to a recovering from his Covid.  Will want to get  an echocardiogram to evaluate him for the possibility of myopericarditis related to Covid.  Current medicines are reviewed at length with the patient today.  The patient does not have concerns regarding medicines.  The following changes have been made:  no change  Labs/ tests ordered today include:   Orders Placed This Encounter  Procedures  . Basic Metabolic Panel (BMET)  . EKG 12-Lead  . ECHOCARDIOGRAM COMPLETE     Disposition: Nurse visit in basic metabolic profile in 3 weeks.  I will see him again in 3 months.    Mertie Moores, MD  10/30/2019 5:23 PM    [address], [place], [place]  [address] Phone: [phone]; Fax: [phone]

## 2019-10-30 ENCOUNTER — Ambulatory Visit: Payer: 59 | Admitting: Cardiovascular Disease

## 2019-10-30 ENCOUNTER — Encounter: Payer: Self-pay | Admitting: Cardiovascular Disease

## 2019-10-30 ENCOUNTER — Other Ambulatory Visit: Payer: Self-pay

## 2019-10-30 VITALS — BP 126/92 | HR 68 | Ht 69.0 in | Wt 192.0 lb

## 2019-10-30 DIAGNOSIS — R5383 Other fatigue: Secondary | ICD-10-CM

## 2019-10-30 DIAGNOSIS — I10 Essential (primary) hypertension: Secondary | ICD-10-CM

## 2019-10-30 DIAGNOSIS — R0602 Shortness of breath: Secondary | ICD-10-CM | POA: Diagnosis not present

## 2019-10-30 HISTORY — DX: Other fatigue: R53.83

## 2019-10-30 MED ORDER — AMLODIPINE BESYLATE 5 MG PO TABS: 5.0000 mg | ORAL_TABLET | Freq: Every day | ORAL | 11 refills | Status: DC

## 2019-10-30 MED ORDER — METOPROLOL TARTRATE 25 MG PO TABS: 25.0000 mg | ORAL_TABLET | Freq: Two times a day (BID) | ORAL | 3 refills | Status: DC

## 2019-10-30 MED ORDER — VALSARTAN 160 MG PO TABS: 160.0000 mg | ORAL_TABLET | Freq: Every day | ORAL | 11 refills | Status: DC

## 2019-10-30 NOTE — Patient Instructions (Addendum)
Medication Instructions:  Your physician has recommended you make the following change in your medication:  DECREASE Amlodipine (Norvasc) to 5 mg once daily START Valsartan (Diovan) 160 mg once daily   *If you need a refill on your cardiac medications before your next appointment, please call your pharmacy*   Lab Work: Your physician recommends that you return for lab work in: 3 weeks on the same day as your Nurse visit  If you have labs (blood work) drawn today and your tests are completely normal, you will receive your results only by: Marland Kitchen MyChart Message (if you have MyChart) OR . A paper copy in the mail If you have any lab test that is abnormal or we need to change your treatment, we will call you to review the results.   Testing/Procedures: Your physician has requested that you have an echocardiogram ASAP. Echocardiography is a painless test that uses sound waves to create images of your heart. It provides your doctor with information about the size and shape of your heart and how well your heart's chambers and valves are working. This procedure takes approximately one hour. There are no restrictions for this procedure.     Follow-Up: Your physician recommends that you return for a follow-up appointment with the Nurse on Thursday May 13 at 12:00 pm  At [facility], you and your health needs are our priority.  As part of our continuing mission to provide you with exceptional heart care, we have created designated Provider Care Teams.  These Care Teams include your primary Cardiologist (physician) and Advanced Practice Providers (APPs -  Physician Assistants and Nurse Practitioners) who all work together to provide you with the care you need, when you need it.   Your next appointment:   3 month(s) on July 19  The format for your next appointment:   In Person  Provider:   Kristeen Miss, MD

## 2019-11-07 ENCOUNTER — Ambulatory Visit (HOSPITAL_COMMUNITY): Payer: 59 | Attending: Internal Medicine

## 2019-11-07 ENCOUNTER — Other Ambulatory Visit: Payer: Self-pay

## 2019-11-07 ENCOUNTER — Telehealth: Payer: Self-pay

## 2019-11-07 DIAGNOSIS — R0602 Shortness of breath: Secondary | ICD-10-CM | POA: Insufficient documentation

## 2019-11-07 NOTE — Telephone Encounter (Signed)
-----   Message from Vesta Mixer, MD sent at 11/07/2019  3:04 PM EDT ----- Normal LV function.  Mild LVH.  No significant valvular abn.

## 2019-11-07 NOTE — Telephone Encounter (Signed)
-----   Message from Philip J Nahser, MD sent at 11/07/2019  3:04 PM EDT ----- Normal LV function.  Mild LVH.  No significant valvular abn.  

## 2019-11-07 NOTE — Telephone Encounter (Signed)
The patient has been notified of the Echo result and verbalized understanding.  All questions (if any) were answered. Sigurd Sos, RN 11/07/2019 3:57 PM

## 2019-11-07 NOTE — Telephone Encounter (Signed)
[id number] 4/30 

## 2019-11-17 ENCOUNTER — Other Ambulatory Visit: Payer: Self-pay

## 2019-11-17 MED ORDER — METOPROLOL TARTRATE 25 MG PO TABS: 25.0000 mg | ORAL_TABLET | Freq: Two times a day (BID) | ORAL | 3 refills | Status: DC

## 2019-11-18 ENCOUNTER — Other Ambulatory Visit: Payer: Self-pay | Admitting: Nurse Practitioner

## 2019-11-19 ENCOUNTER — Other Ambulatory Visit: Payer: Self-pay

## 2019-11-19 ENCOUNTER — Other Ambulatory Visit: Payer: 59 | Admitting: *Deleted

## 2019-11-19 ENCOUNTER — Ambulatory Visit (INDEPENDENT_AMBULATORY_CARE_PROVIDER_SITE_OTHER): Payer: 59 | Admitting: Nurse Practitioner

## 2019-11-19 VITALS — BP 112/74 | HR 76 | Ht 69.0 in | Wt 195.8 lb

## 2019-11-19 DIAGNOSIS — I10 Essential (primary) hypertension: Secondary | ICD-10-CM

## 2019-11-19 DIAGNOSIS — R5383 Other fatigue: Secondary | ICD-10-CM

## 2019-11-19 DIAGNOSIS — R0602 Shortness of breath: Secondary | ICD-10-CM

## 2019-11-19 LAB — BASIC METABOLIC PANEL
BUN/Creatinine Ratio: 12 (ref 9–20)
BUN: 17 mg/dL (ref 6–24)
CO2: 28 mmol/L (ref 20–29)
Calcium: 9.7 mg/dL (ref 8.7–10.2)
Chloride: 98 mmol/L (ref 96–106)
Creatinine, Ser: 1.42 mg/dL — ABNORMAL HIGH (ref 0.76–1.27)
GFR calc Af Amer: 66 mL/min/{1.73_m2} (ref >59)
GFR calc non Af Amer: 57 mL/min/{1.73_m2} — ABNORMAL LOW (ref >59)
Glucose: 95 mg/dL (ref 65–99)
Potassium: 4.6 mmol/L (ref 3.5–5.2)
Sodium: 138 mmol/L (ref 134–144)

## 2019-11-19 MED ORDER — CHLORTHALIDONE 25 MG PO TABS: 25.0000 mg | ORAL_TABLET | Freq: Every day | ORAL | 11 refills | Status: DC

## 2019-11-19 NOTE — Patient Instructions (Addendum)
Medication Instructions:  Your physician has recommended you make the following change in your medication:  STOP Valsartan STOP HCTZ (Hydrochlorothiazide) START Chlorthalidone (Hygroton) 25 mg once daily in the morning  *If you need a refill on your cardiac medications before your next appointment, please call your pharmacy*   Lab Work: Your physician recommends that you return for lab work in: 2 weeks for basic metabolic panel  If you have labs (blood work) drawn today and your tests are completely normal, you will receive your results only by: Marland Kitchen MyChart Message (if you have MyChart) OR . A paper copy in the mail If you have any lab test that is abnormal or we need to change your treatment, we will call you to review the results.   Testing/Procedures: None Ordered   Follow-Up: At [facility], you and your health needs are our priority.  As part of our continuing mission to provide you with exceptional heart care, we have created designated Provider Care Teams.  These Care Teams include your primary Cardiologist (physician) and Advanced Practice Providers (APPs -  Physician Assistants and Nurse Practitioners) who all work together to provide you with the care you need, when you need it.   Your next appointment:   2 month(s) on July 19 at 9:00 am  The format for your next appointment:   In Person  Provider:   Kristeen Miss, MD

## 2019-11-19 NOTE — Progress Notes (Signed)
1.) Reason for visit: BP check since starting valsartan 160 mg at last office visit on 4/22  2.) Name of MD requesting visit: Dr. Elease Hashimoto  3.) H&P: Patient presents for follow-up nurse visit for BP check since starting valsartan on 4/22. He states his BP has been well-controlled at home with SBP 110-116 mmHg but he is having diarrhea. States he remembers having diarrhea in the past with taking losartan. Was seen by endocrinologist last week and reported serum creatinine of 1.4.   4.) ROS related to problem: Patient continues to have post-Covid fatigue. He cannot discern if lower BP readings is causing additional fatigue but he is happy that the BP numbers are lower.   Assessment and plan per MD:  Stop valsartan due to diarrhea. Stop HCTZ and start chlorthalidone 25 mg once daily. Checking BMET today and planning to recheck in 2 weeks. Will determine need for potassium supplement from lab results. Keep follow-up appointment for July and call sooner with questions or concerns.

## 2019-11-20 ENCOUNTER — Other Ambulatory Visit: Payer: 59

## 2019-11-20 ENCOUNTER — Ambulatory Visit: Payer: 59

## 2019-12-03 ENCOUNTER — Other Ambulatory Visit: Payer: 59

## 2019-12-22 ENCOUNTER — Encounter: Payer: Self-pay | Admitting: Neurology

## 2019-12-22 ENCOUNTER — Ambulatory Visit: Payer: 59 | Admitting: Neurology

## 2019-12-22 VITALS — BP 133/89 | HR 78 | Ht 69.0 in | Wt 193.0 lb

## 2019-12-22 DIAGNOSIS — R253 Fasciculation: Secondary | ICD-10-CM | POA: Diagnosis not present

## 2019-12-22 DIAGNOSIS — R413 Other amnesia: Secondary | ICD-10-CM | POA: Diagnosis not present

## 2019-12-22 DIAGNOSIS — R531 Weakness: Secondary | ICD-10-CM | POA: Insufficient documentation

## 2019-12-22 HISTORY — DX: Fasciculation: R25.3

## 2019-12-22 MED ORDER — TRAZODONE HCL 50 MG PO TABS: 100.0000 mg | ORAL_TABLET | Freq: Every day | ORAL | 3 refills | Status: DC

## 2019-12-22 NOTE — Progress Notes (Signed)
PATIENT: Michael Warren DOB: [birth year]  Chief Complaint  Patient presents with  . Post-Covid Changes    Reports having COVID-19 in February 2021. Since his infection, he has noticed muscle weakness (especially in legs), faciculations throughout body (feels like being hooked up to TENS unit), ringing in right ear that can get quite loud and brain fog (slow processing, lack of focus, word finding difficulty). Denies memory loss. 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  Marland Kitchen PCP    Gweneth Dimitri, MD     HISTORICAL  Michael Warren is a 49 year old male, seen in request by his primary care physician Dr. Lamonte Sakai for post Covid complications, initial evaluation was on December 22, 2019.  I reviewed and summarized the referring note. 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  He suffered Covid infection in February 2021, presented with fever, headache, diffuse body achy pain, cough, lost sense of smell and taste, lasting for few weeks, he was not able to go back to his previous job since his Covid infection  He continues to complain about mental claudication, generalized weakness, felt he is mentally and physically not able to go back to his previous job, he noticed frequent muscle twitching, feeling exhausted all the time, not sleeping well, sometimes woke up in the middle of the night whole body shaking, as if TENS unit traveling through his whole body  Laboratory evaluation in May 2021 normal BMP  REVIEW OF SYSTEMS: Full 14 system review of systems performed and notable only for as above All other review of systems were negative.  ALLERGIES: No Known Allergies  HOME MEDICATIONS: Current Outpatient Medications  Medication Sig Dispense Refill  . amLODipine (NORVASC) 5 MG tablet Take 5 mg  by mouth daily.    . B-D 3CC LUER-LOK SYR 21GX1-1/2 21G X 1-1/2" 3 ML MISC AS DIRECTED FOR USE WITH TESTOSTERONE IM  5  . calcium carbonate (TUMS - DOSED IN MG ELEMENTAL CALCIUM) 500 MG chewable tablet Chew 2 tablets by mouth 2 (two) times daily as needed for indigestion or heartburn.    . cetirizine (ZYRTEC) 10 MG tablet Take 10 mg by mouth daily as needed for allergies.    . Cholecalciferol (VITAMIN D3) 5000 UNITS CAPS Take 5,000 Units by mouth daily.    Marland Kitchen FLOVENT HFA 110 MCG/ACT inhaler Inhale 2 puffs into the lungs 2 (two) times daily.    . hydrochlorothiazide (HYDRODIURIL) 25 MG tablet Take 25 mg by mouth daily.    Marland Kitchen ibuprofen (ADVIL,MOTRIN) 200 MG tablet Take 2 tablets (400 mg total) by mouth 3 (three) times daily. 30 tablet 0  . levothyroxine (SYNTHROID, LEVOTHROID) 125 MCG tablet Take 125 mcg by mouth daily.    Marland Kitchen liothyronine (CYTOMEL) 5 MCG tablet Take 5 mcg by mouth daily.    . metoprolol tartrate (LOPRESSOR) 25 MG tablet Take 1 tablet (25 mg total) by mouth 2 (two) times daily. 180 tablet 3  . omeprazole (PRILOSEC) 40 MG capsule Take 40 mg by mouth daily.    Marland Kitchen oxymetazoline (AFRIN) 0.05 % nasal spray Place 1 spray into both nostrils daily as needed for congestion.    . tadalafil (CIALIS) 20 MG tablet Take 20 mg by mouth daily as needed for erectile dysfunction.    Marland Kitchen testosterone cypionate (DEPOTESTOTERONE CYPIONATE) 200  MG/ML injection Inject 200 mg into the muscle every 14 (fourteen) days.    . valACYclovir (VALTREX) 1000 MG tablet Take 1 tablet by mouth as needed.    . VENTOLIN HFA 108 (90 BASE) MCG/ACT inhaler Inhale 2 puffs into the lungs daily as needed for wheezing or shortness of breath.     . traZODone (DESYREL) 50 MG tablet Take 2 tablets (100 mg total) by mouth at bedtime. 60 tablet 3   No current facility-administered medications for this visit.    PAST MEDICAL HISTORY: Past Medical History:  Diagnosis Date  . Brain fog   . Elevated blood pressure   . 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 Esophageal ulcer   . Fasciculations   . Muscle weakness   . Ringing in ear   . Thyroid disease     PAST SURGICAL HISTORY: Past Surgical History:  Procedure Laterality Date  . COLONOSCOPY    . URETEROSCOPY      FAMILY HISTORY: Family History  Problem Relation Age of Onset  . Goiter Mother   . Other Mother        hx of pituitary tumor  . Other Father        mitochrondrial syndrome  . Coronary artery disease Maternal Grandfather   . Coronary artery disease Maternal Grandmother     SOCIAL HISTORY: Social History   Socioeconomic History  . Marital status: Married    Spouse name: Not on file  . Number of children: 2  . Years of education: college  . Highest education level: Not on file  Occupational History  . Occupation: Quarry manager  Tobacco Use  . Smoking status: Never Smoker  . Smokeless tobacco: Former Neurosurgeon    Types: Snuff  Substance and Sexual Activity  . Alcohol use: Yes    Comment: social  . Drug use: Never  . Sexual activity: Not on file  Other Topics Concern  . Not on file  Social History Narrative   Lives at home with his family.   Right-handed.   3 cups caffeine per day.   Social Determinants of Health   Financial Resource Strain:   . Difficulty of Paying Living Expenses:   Food Insecurity:   . Worried About Programme researcher, broadcasting/film/video in the Last Year:   . Barista in the Last Year:   Transportation Needs:   . Freight forwarder (Medical):   Marland Kitchen Lack of Transportation (Non-Medical):   Physical Activity:   . Days of Exercise per Week:   . Minutes of Exercise per Session:   Stress:   . Feeling of Stress :   Social Connections:   . Frequency of Communication with Friends and Family:   . Frequency of Social Gatherings with Friends and Family:   . Attends Religious Services:   . Active Member of Clubs or Organizations:   . Attends Banker Meetings:     Marland Kitchen Marital Status:   Intimate Partner Violence:   . Fear of Current or Ex-Partner:   . Emotionally Abused:   Marland Kitchen Physically Abused:   . Sexually Abused:      PHYSICAL EXAM   Vitals:   12/22/19 1450  BP: 133/89  Pulse: 78  Weight: 193 lb (87.5 kg)  Height: 5\' 9"  (1.753 m)   Not recorded     Body mass index is 28.5 kg/m.  PHYSICAL EXAMNIATION:  Gen: NAD, conversant, well nourised, well  groomed                     Cardiovascular: Regular rate rhythm, no peripheral edema, warm, nontender. Eyes: Conjunctivae clear without exudates or hemorrhage Neck: Supple, no carotid bruits. Pulmonary: Clear to auscultation bilaterally   NEUROLOGICAL EXAM:  MENTAL STATUS: MMSE - Mini Mental State Exam 12/22/2019  Orientation to time 5  Orientation to Place 5  Registration 3  Attention/ Calculation 5  Recall 3  Language- name 2 objects 2  Language- repeat 1  Language- follow 3 step command 3  Language- read & follow direction 1  Write a sentence 1  Copy design 1  Total score 30     CRANIAL NERVES: CN II: Visual fields are full to confrontation. Pupils are round equal and briskly reactive to light. CN III, IV, VI: extraocular movement are normal. No ptosis. CN V: Facial sensation is intact to light touch CN VII: Face is symmetric with normal eye closure  CN VIII: Hearing is normal to causal conversation. CN IX, X: Phonation is normal. CN XI: Head turning and shoulder shrug are intact  MOTOR: There is no pronator drift of out-stretched arms. Muscle bulk and tone are normal. Muscle strength is normal.  REFLEXES: Reflexes are 2+ and symmetric at the biceps, triceps, knees, and ankles. Plantar responses are flexor.  SENSORY: Intact to light touch, pinprick and vibratory sensation are intact in fingers and toes.  COORDINATION: There is no trunk or limb dysmetria noted.  GAIT/STANCE: Posture is normal. Gait is steady with normal steps, base, arm swing, and turning. Heel and  toe walking are normal. Tandem gait is normal.  Romberg is absent.   DIAGNOSTIC DATA (LABS, IMAGING, TESTING) - I reviewed patient records, labs, notes, testing and imaging myself where available.   ASSESSMENT AND PLAN  Raheen Capili is a 49 y.o. male   Constellation of complaints following his Covid infection in February 2021  This include difficulty focusing, short-term memory loss, difficulty sleeping, muscle tremor, generalized weakness  Essentially normal neurological examinations, Mini-Mental Status 30 out of 30  MRI of the brain to rule out structural lesion  Laboratory evaluations  EEG  EMG nerve conduction study Anxiety, difficulty sleeping  Trazodone 50 mg titrating to 100 mg every night   Marcial Pacas, M.D. Ph.D.  [facility] [address], [place], [place] [address] Ph: [phone] Fax: [phone]  CC: Cari Caraway, MD   Orders Placed This Encounter  Procedures  . MR BRAIN WO CONTRAST  . Vitamin B12  . RPR  . HIV Antibody (routine testing w rflx)  . Folate  . C-reactive protein  . CK  . Ferritin  . ANA w/Reflex if Positive  . VITAMIN D 25 Hydroxy (Vit-D Deficiency, Fractures)  . B. burgdorfi antibodies  . EEG adult  . NCV with EMG(electromyography)     Marcial Pacas, M.D. Ph.D.  [facility] [address], [place], [place] [address] Ph: [phone] Fax: [phone]  CC: Cari Caraway, MD

## 2019-12-22 NOTE — Patient Instructions (Signed)
Common  Gastrointestinal: Constipation (7% to 8% ), Diarrhea (up to 9% ), Nausea (21% ), Vomiting (at least 1% ), Xerostomia (14% to 33.8% )  Musculoskeletal: Backache (5% )  Neurologic: Confusion (up to 5.7% ), Dizziness (25% ), Headache (9.9% to 33% ), Insomnia (6.4% to 9.9% ), Somnolence (23.9% to 46% )  Ophthalmic: Blurred vision (5% to 14.7% )  Psychiatric: Dream disorder (up to 5.1% ), Feeling nervous (6.4% to 14.8% )  Other: Fatigue (5.7% to 15% )

## 2019-12-24 ENCOUNTER — Telehealth: Payer: Self-pay | Admitting: Neurology

## 2019-12-24 DIAGNOSIS — R413 Other amnesia: Secondary | ICD-10-CM

## 2019-12-24 DIAGNOSIS — R531 Weakness: Secondary | ICD-10-CM

## 2019-12-24 LAB — RPR: RPR Ser Ql: NONREACTIVE

## 2019-12-24 LAB — C-REACTIVE PROTEIN: CRP: 1 mg/L (ref 0–10)

## 2019-12-24 LAB — CK: Total CK: 197 U/L (ref 49–439)

## 2019-12-24 LAB — VITAMIN B12: Vitamin B-12: 582 pg/mL (ref 232–1245)

## 2019-12-24 LAB — ANA W/REFLEX IF POSITIVE: Anti Nuclear Antibody (ANA): NEGATIVE

## 2019-12-24 LAB — HIV ANTIBODY (ROUTINE TESTING W REFLEX): HIV Screen 4th Generation wRfx: NONREACTIVE

## 2019-12-24 LAB — B. BURGDORFI ANTIBODIES: Lyme IgG/IgM Ab: <0.91 {ISR} (ref 0.00–0.90)

## 2019-12-24 LAB — VITAMIN D 25 HYDROXY (VIT D DEFICIENCY, FRACTURES): Vit D, 25-Hydroxy: 51.1 ng/mL (ref 30.0–100.0)

## 2019-12-24 LAB — FOLATE: Folate: 12.9 ng/mL (ref >3.0)

## 2019-12-24 LAB — FERRITIN: Ferritin: 25 ng/mL — ABNORMAL LOW (ref 30–400)

## 2019-12-24 NOTE — Telephone Encounter (Signed)
Please call patient, extensive laboratory evaluation showed mildly decreased and 25, normal should be 30 NG/mL and above  I have entered repeat laboratory evaluations, including iron panel, he can come into our office for repeat laboratory evaluation

## 2019-12-24 NOTE — Telephone Encounter (Signed)
I spoke to the patient. He verbalized understanding of his results and is agreeable to come in for repeat labs. He was provided with our office hours.

## 2019-12-25 ENCOUNTER — Other Ambulatory Visit (INDEPENDENT_AMBULATORY_CARE_PROVIDER_SITE_OTHER): Payer: Self-pay

## 2019-12-25 ENCOUNTER — Telehealth: Payer: Self-pay | Admitting: Neurology

## 2019-12-25 DIAGNOSIS — R413 Other amnesia: Secondary | ICD-10-CM

## 2019-12-25 DIAGNOSIS — Z0289 Encounter for other administrative examinations: Secondary | ICD-10-CM

## 2019-12-25 DIAGNOSIS — R531 Weakness: Secondary | ICD-10-CM

## 2019-12-25 NOTE — Telephone Encounter (Signed)
[employer] pending faxed notes 

## 2019-12-26 LAB — CBC WITH DIFFERENTIAL/PLATELET
Basophils Absolute: 0.1 10*3/uL (ref 0.0–0.2)
Basos: 1 %
EOS (ABSOLUTE): 0.3 10*3/uL (ref 0.0–0.4)
Eos: 5 %
Hematocrit: 50.9 % (ref 37.5–51.0)
Hemoglobin: 17.2 g/dL (ref 13.0–17.7)
Immature Grans (Abs): 0 10*3/uL (ref 0.0–0.1)
Immature Granulocytes: 1 %
Lymphocytes Absolute: 1.1 10*3/uL (ref 0.7–3.1)
Lymphs: 19 %
MCH: 30.1 pg (ref 26.6–33.0)
MCHC: 33.8 g/dL (ref 31.5–35.7)
MCV: 89 fL (ref 79–97)
Monocytes Absolute: 0.8 10*3/uL (ref 0.1–0.9)
Monocytes: 13 %
Neutrophils Absolute: 3.7 10*3/uL (ref 1.4–7.0)
Neutrophils: 61 %
Platelets: 211 10*3/uL (ref 150–450)
RBC: 5.71 x10E6/uL (ref 4.14–5.80)
RDW: 14.1 % (ref 11.6–15.4)
WBC: 6 10*3/uL (ref 3.4–10.8)

## 2019-12-26 LAB — FOLATE: Folate: 16.8 ng/mL (ref >3.0)

## 2019-12-26 LAB — FERRITIN: Ferritin: 21 ng/mL — ABNORMAL LOW (ref 30–400)

## 2019-12-26 LAB — IRON AND TIBC
Iron Saturation: 28 % (ref 15–55)
Iron: 105 ug/dL (ref 38–169)
Total Iron Binding Capacity: 381 ug/dL (ref 250–450)
UIBC: 276 ug/dL (ref 111–343)

## 2019-12-29 ENCOUNTER — Telehealth: Payer: Self-pay | Admitting: Neurology

## 2019-12-29 NOTE — Telephone Encounter (Signed)
Please call patient, laboratory evaluation showed decreased ferritin 21, indicating mild decreased iron storage,  Rest of the laboratory evaluation showed no significant abnormalities, I have forwarded his lab evaluation to his primary care physician,McNeill, Toniann Fail, MD, hemoglobin level was normal  He should contact him for further monitoring of his iron level

## 2019-12-29 NOTE — Telephone Encounter (Signed)
unable to reach the pt the mailbox is full   [facility] Auth: [id number] (exp. 12/25/19 to 02/08/20)

## 2019-12-29 NOTE — Telephone Encounter (Signed)
I called pt and discussed his lab results and recommendations. Pt will follow up with Dr. Hollice Gong regarding his ferritin level. Pt verbalized understanding of results. Pt had no questions at this time but was encouraged to call back if questions arise.

## 2020-01-15 ENCOUNTER — Other Ambulatory Visit: Payer: Self-pay | Admitting: *Deleted

## 2020-01-15 MED ORDER — TRAZODONE HCL 50 MG PO TABS: 100.0000 mg | ORAL_TABLET | Freq: Every day | ORAL | 0 refills | Status: DC

## 2020-01-21 ENCOUNTER — Ambulatory Visit: Payer: 59 | Admitting: Neurology

## 2020-01-21 DIAGNOSIS — R531 Weakness: Secondary | ICD-10-CM

## 2020-01-21 DIAGNOSIS — R413 Other amnesia: Secondary | ICD-10-CM

## 2020-01-21 DIAGNOSIS — R253 Fasciculation: Secondary | ICD-10-CM

## 2020-01-22 NOTE — Procedures (Signed)
   HISTORY:  TECHNIQUE:  This is a routine 16 channel EEG recording with one channel devoted to a limited EKG recording.  It was performed during wakefulness, drowsiness and asleep.  Hyperventilation and photic stimulation were performed as activating procedures.  There are minimum muscle and movement artifact noted.  Upon maximum arousal, posterior dominant waking rhythm consistent of rhythmic alpha range activity, with frequency of 10 hz. Activities are symmetric over the bilateral posterior derivations and attenuated with eye opening.  Hyperventilation produced mild/moderate buildup with higher amplitude and the slower activities noted.  Photic stimulation did not alter the tracing.  During EEG recording, patient developed drowsiness and no deeper stage of sleep was achieved  During EEG recording, there was no epileptiform discharge noted.  EKG demonstrate sinus rhythm, with heart rate of 80 bpm  CONCLUSION: This is a  normal awake EEG.  There is no electrodiagnostic evidence of epileptiform discharge.  Levert Feinstein, M.D. Ph.D.  [facility] [address] [place], [place] [address] Phone: [phone] Fax:      [phone]

## 2020-01-25 ENCOUNTER — Encounter: Payer: Self-pay | Admitting: Cardiovascular Disease

## 2020-01-25 NOTE — Progress Notes (Signed)
Cardiology Office Note   Date:  01/26/2020   ID:  Michael Warren, DOB 1971/05/03, MRN [id number]  PCP:  Gweneth Dimitri, MD  Cardiologist:   Kristeen Miss, MD   Chief Complaint  Patient presents with   Hypertension   Problem List 1. Pericarditis 2. Dyspnea    Michael Warren is a 49 y.o. male who presents for follow up for his pericarditis.  His pleuretic CP is much better.   Still has DOE.  Is on Mitiare ( brand name for colchicine )   BP has been elevated.     Watches his salt .  Has PVCs On occasion  Dec. 6, 2017:  Michael Warren is doing well . BP readings at home are slighlty elevated on occasion.   At other times, BP is mildly elevated.  Still may eat some extra salt  On occasion   October 30, 2019:  Michael Warren is seen today for follow-up visit. He has a history of pericarditis from several years ago.  He has a history of Covid infection in February, 2021.  He is noticed that his heart rate is beating faster since that time.  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  He has had increasing shortness of breath especially with ambulation.   The episode started at night.  Work him up. HR was 118.  Was very sweaty, Could not catch her breath  We started metoprolol 25 BID    HR is lower . Still is fatigued,   Getting over Covid 2 months ago  Tries to walk, limited by dyspnea and muscle fatigue   We started him on metoprolol 25 mg twice a day and schedule him to come see me for further evaluation. No pleuretic CP .     TSH is normal on October 16, 2019 Has not been able to return to work due to this fatigue and muscle soreness.   January 26, 2020:  Michael Warren is seen for follow up of his hx of pericarditis, past covid infection Still has some shortness of breath  No cp .  Is avoiding salt.   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     B-D 3CC LUER-LOK SYR 21GX1-1/2 21G X 1-1/2" 3 ML MISC AS DIRECTED FOR USE WITH TESTOSTERONE IM  5   calcium carbonate (TUMS - DOSED IN MG ELEMENTAL CALCIUM) 500 MG chewable tablet Chew 2 tablets by mouth 2 (two) times daily as needed for indigestion or heartburn.     Cholecalciferol (VITAMIN D3) 5000 UNITS CAPS Take 5,000 Units by mouth daily.     FLOVENT HFA 110 MCG/ACT inhaler Inhale 2 puffs into the lungs 2 (two) times daily.     hydrochlorothiazide (HYDRODIURIL) 25 MG tablet Take 25 mg  by mouth daily.     ibuprofen (ADVIL) 200 MG tablet Take 200 mg by mouth as needed for moderate pain.     levothyroxine (SYNTHROID, LEVOTHROID) 125 MCG tablet Take 125 mcg by mouth daily.     liothyronine (CYTOMEL) 5 MCG tablet Take 5 mcg by mouth daily.     metoprolol tartrate (LOPRESSOR) 25 MG tablet Take 1 tablet (25 mg total) by mouth 2 (two) times daily. 180 tablet 3   omeprazole (PRILOSEC) 40 MG capsule Take 40 mg by mouth daily.     oxymetazoline (AFRIN) 0.05 % nasal spray Place 1 spray into both nostrils daily as needed for congestion.     tadalafil (CIALIS) 20 MG tablet Take 20 mg by mouth daily as needed for erectile dysfunction.     testosterone cypionate (DEPOTESTOTERONE CYPIONATE) 200 MG/ML injection Inject 0.6 mg into the muscle once a week.      traZODone (DESYREL) 50 MG tablet Take 2 tablets (100 mg total) by mouth at bedtime. 180 tablet 0   valACYclovir (VALTREX) 1000 MG tablet Take 1 tablet by mouth as  needed.     VENTOLIN HFA 108 (90 BASE) MCG/ACT inhaler Inhale 2 puffs into the lungs daily as needed for wheezing or shortness of breath.      No current facility-administered medications for this visit.    Allergies:   Patient has no known allergies.    Social History:  The patient  reports that he has never smoked. He quit smokeless tobacco use about 4 years ago.  His smokeless tobacco use included snuff. He reports current alcohol use. He reports that he does not use drugs.   Family History:  The patient's family history includes Coronary artery disease in his maternal grandfather and maternal grandmother; Goiter in his mother; Other in his father and mother.    ROS:  Please see the history of present illness.     Physical Exam: Blood pressure 118/70, pulse 69, height 5\' 9"  (1.753 m), weight 194 lb (88 kg), SpO2 96 %.  GEN:  Well nourished, well developed in no acute distress HEENT: Normal NECK: No JVD; No carotid bruits LYMPHATICS: No lymphadenopathy CARDIAC: RRR , no murmurs, rubs, gallops RESPIRATORY:  Clear to auscultation without rales, wheezing or rhonchi  ABDOMEN: Soft, non-tender, non-distended MUSCULOSKELETAL:  No edema; No deformity  SKIN: Warm and dry NEUROLOGIC:  Alert and oriented x 3    EKG:      Recent Labs: 11/19/2019: BUN 17; Creatinine, Ser 1.42; Potassium 4.6; Sodium 138 12/25/2019: Hemoglobin 17.2; Platelets 211    Lipid Panel No results found for: CHOL, TRIG, HDL, CHOLHDL, VLDL, LDLCALC, LDLDIRECT    Wt Readings from Last 3 Encounters:  01/26/20 194 lb (88 kg)  12/22/19 193 lb (87.5 kg)  11/19/19 195 lb 12 oz (88.8 kg)      Other studies Reviewed: Additional studies/ records that were reviewed today include: . Review of the above records demonstrates:    ASSESSMENT AND PLAN:  1.  Pericarditis:   No recurrent pericarditis .     2.  HTN:  BP is well controlled.  .  3.  Recent Covid infection:    Seems to be having lots of fatigue  following his Covid infection.  He is cut out all the preservatives.  He eats a low carbohydrate diet.  Lots of fresh vegetables and lean meats. He has been referred down to [facility] to a post Covid program.  I also suggest that he discuss further  options with Dr. Uvaldo Rising and might look into [facility] for suggestions .   Current medicines are reviewed at length with the patient today.  The patient does not have concerns regarding medicines.  The following changes have been made:  no change  Labs/ tests ordered today include:   No orders of the defined types were placed in this encounter.    Disposition: Nurse visit in basic metabolic profile in 3 weeks.  I will see him again in 3 months.    Kristeen Miss, MD  01/26/2020 9:29 AM    [facility] [address] [place], [place], [place]  [address] Phone: [phone]; Fax: [phone]

## 2020-01-26 ENCOUNTER — Encounter: Payer: Self-pay | Admitting: Cardiovascular Disease

## 2020-01-26 ENCOUNTER — Other Ambulatory Visit: Payer: Self-pay

## 2020-01-26 ENCOUNTER — Ambulatory Visit: Payer: 59 | Admitting: Cardiovascular Disease

## 2020-01-26 VITALS — BP 118/70 | HR 69 | Ht 69.0 in | Wt 194.0 lb

## 2020-01-26 DIAGNOSIS — R5383 Other fatigue: Secondary | ICD-10-CM | POA: Diagnosis not present

## 2020-01-26 DIAGNOSIS — I10 Essential (primary) hypertension: Secondary | ICD-10-CM | POA: Diagnosis not present

## 2020-01-26 NOTE — Patient Instructions (Addendum)
[facility]   Medication Instructions:  Your physician recommends that you continue on your current medications as directed. Please refer to the Current Medication list given to you today.  *If you need a refill on your cardiac medications before your next appointment, please call your pharmacy*   Lab Work: None Ordered If you have labs (blood work) drawn today and your tests are completely normal, you will receive your results only by: Marland Kitchen MyChart Message (if you have MyChart) OR . A paper copy in the mail If you have any lab test that is abnormal or we need to change your treatment, we will call you to review the results.   Testing/Procedures: None Ordered   Follow-Up: At [facility], you and your health needs are our priority.  As part of our continuing mission to provide you with exceptional heart care, we have created designated Provider Care Teams.  These Care Teams include your primary Cardiologist (physician) and Advanced Practice Providers (APPs -  Physician Assistants and Nurse Practitioners) who all work together to provide you with the care you need, when you need it.     Your next appointment:    As Needed  The format for your next appointment:   In Person  Provider:   You may see Kristeen Miss, MD or one of the following Advanced Practice Providers on your designated Care Team:    Tereso Newcomer, PA-C  [place], [place]

## 2020-02-02 ENCOUNTER — Telehealth: Payer: Self-pay | Admitting: Neurology

## 2020-02-02 ENCOUNTER — Ambulatory Visit (INDEPENDENT_AMBULATORY_CARE_PROVIDER_SITE_OTHER): Payer: 59 | Admitting: Neurology

## 2020-02-02 ENCOUNTER — Encounter: Payer: 59 | Admitting: Neurology

## 2020-02-02 ENCOUNTER — Other Ambulatory Visit: Payer: Self-pay

## 2020-02-02 DIAGNOSIS — R253 Fasciculation: Secondary | ICD-10-CM

## 2020-02-02 DIAGNOSIS — R413 Other amnesia: Secondary | ICD-10-CM

## 2020-02-02 DIAGNOSIS — Z0289 Encounter for other administrative examinations: Secondary | ICD-10-CM

## 2020-02-02 DIAGNOSIS — R531 Weakness: Secondary | ICD-10-CM | POA: Diagnosis not present

## 2020-02-02 NOTE — Procedures (Signed)
Full Name: Michael Warren Gender: Male MRN #: [id number] Date of Birth: 13-Nov-1970    Visit Date: 02/02/2020 08:59 Age: 49 Years Examining Physician: Levert Feinstein, MD  Referring Physician: Levert Feinstein, MD Height: 5 feet 9 inch History: 49 year old male presented with generalized fatigue, weakness, difficulty sleeping following his Covid infection.  Summary of the test: Nerve conduction study: Left sural, superficial peroneal, median, ulnar sensory responses were normal. Left peroneal to EDB, tibial, median, and ulnar motor responses were normal.  Electromyography: Selected needle examination of left upper, lower extremity muscles.   Conclusion: This is a normal study.  There was no electrodiagnostic evidence of left upper, lower extremity neuropathy, intrinsic muscle disease.    ------------------------------- Physician Name, M.D.  [facility] [address] [place], [place] [address] Tel: [phone] Fax: [phone]  Verbal informed consent was obtained from the patient, patient was informed of potential risk of procedure, including bruising, bleeding, hematoma formation, infection, muscle weakness, muscle pain, numbness, among others.        MNC    Nerve / Sites Muscle Latency Ref. Amplitude Ref. Rel Amp Segments Distance Velocity Ref. Area    ms ms mV mV %  cm m/s m/s mVms  L Median - APB     Wrist APB 3.1 ?4.4 9.4 ?4.0 100 Wrist - APB 7   29.4     Upper arm APB 7.0  8.8  94.3 Upper arm - Wrist 22 56 ?49 26.8  L Ulnar - ADM     Wrist ADM 2.7 ?3.3 11.5 ?6.0 100 Wrist - ADM 7   36.1     B.Elbow ADM 6.2  10.4  90.5 B.Elbow - Wrist 22 63 ?49 34.9     A.Elbow ADM 7.9  10.5  100 A.Elbow - B.Elbow 10 59 ?49 34.2  L Peroneal - EDB     Ankle EDB 4.4 ?6.5 8.3 ?2.0 100 Ankle - EDB 9   33.2     Fib head EDB 10.7  7.7  92.5 Fib head - Ankle 30 48 ?44 33.0     Pop fossa EDB 12.9  5.7  73.9 Pop fossa - Fib head 10 47 ?44 26.7         Pop fossa -  Ankle      L Tibial - AH     Ankle AH 3.6 ?5.8 10.5 ?4.0 100 Ankle - AH 9   25.0     Pop fossa AH 13.2  8.7  83.3 Pop fossa - Ankle 40 42 ?41 26.7             SNC    Nerve / Sites Rec. Site Peak Lat Ref.  Amp Ref. Segments Distance Peak Diff Ref.    ms ms V V  cm ms ms  L Sural - Ankle (Calf)     Calf Ankle 4.3 ?4.4 13 ?6 Calf - Ankle 14    L Superficial peroneal - Ankle     Lat leg Ankle 4.2 ?4.4 7 ?6 Lat leg - Ankle 14    L Median, Ulnar - Transcarpal comparison     Median Palm Wrist 2.0 ?2.2 82 ?35 Median Palm - Wrist 8       Ulnar Palm Wrist 2.0 ?2.2 34 ?12 Ulnar Palm - Wrist 8          Median Palm - Ulnar Palm  -0.0 ?0.4  L Median - Orthodromic (Dig II, Mid palm)  Dig II Wrist 3.0 ?3.4 17 ?10 Dig II - Wrist 13    L Ulnar - Orthodromic, (Dig V, Mid palm)     Dig V Wrist 2.7 ?3.1 10 ?5 Dig V - Wrist 39                 F  Wave    Nerve F Lat Ref.   ms ms  L Tibial - AH 50.9 ?56.0  L Ulnar - ADM 28.3 ?32.0         EMG Summary Table    Spontaneous MUAP Recruitment  Muscle IA Fib PSW Fasc Other Amp Dur. Poly Pattern  L. Pronator teres Normal None None None _______ Normal Normal Normal Normal  L. First dorsal interosseous Normal None None None _______ Normal Normal Normal Normal  L. Biceps brachii Normal None None None _______ Normal Normal Normal Normal  L. Deltoid Normal None None None _______ Normal Normal Normal Normal  L. Triceps brachii Normal None None None _______ Normal Normal Normal Normal  L. Tibialis anterior Normal None None None _______ Normal Normal Normal Normal  L. Gastrocnemius (Medial head) Normal None None None _______ Normal Normal Normal Normal  L. Peroneus longus Normal None None None _______ Normal Normal Normal Normal  L. Tibialis posterior Normal None None None _______ Normal Normal Normal Normal  L. Vastus lateralis Normal None None None _______ Normal Normal Normal Normal

## 2020-02-02 NOTE — Telephone Encounter (Signed)
Please make sure he is on schedule for MRI of brain as previously ordered

## 2020-02-06 ENCOUNTER — Other Ambulatory Visit: Payer: Self-pay | Admitting: Neurology

## 2020-02-10 NOTE — Telephone Encounter (Signed)
Patient returned my call he is scheduled at [facility] for 02/11/20.

## 2020-02-10 NOTE — Telephone Encounter (Signed)
[employer] Auth: [id number] (exp. 02/09/20 to 03/25/20)  Left voicemail for patient to call back about scheduling mri.

## 2020-02-11 ENCOUNTER — Ambulatory Visit: Payer: 59

## 2020-02-11 DIAGNOSIS — R413 Other amnesia: Secondary | ICD-10-CM | POA: Diagnosis not present

## 2020-02-11 DIAGNOSIS — R253 Fasciculation: Secondary | ICD-10-CM | POA: Diagnosis not present

## 2020-02-11 DIAGNOSIS — R531 Weakness: Secondary | ICD-10-CM | POA: Diagnosis not present

## 2020-03-22 ENCOUNTER — Other Ambulatory Visit: Payer: Self-pay | Admitting: Neurology

## 2020-05-12 ENCOUNTER — Encounter: Payer: Self-pay | Admitting: Psychology

## 2020-05-20 ENCOUNTER — Other Ambulatory Visit: Payer: Self-pay

## 2020-05-20 ENCOUNTER — Ambulatory Visit: Payer: 59 | Admitting: Psychology

## 2020-05-20 ENCOUNTER — Encounter: Payer: Self-pay | Admitting: Psychology

## 2020-05-20 ENCOUNTER — Ambulatory Visit (INDEPENDENT_AMBULATORY_CARE_PROVIDER_SITE_OTHER): Payer: 59 | Admitting: Psychology

## 2020-05-20 DIAGNOSIS — R4189 Other symptoms and signs involving cognitive functions and awareness: Secondary | ICD-10-CM

## 2020-05-20 DIAGNOSIS — Z8616 Personal history of COVID-19: Secondary | ICD-10-CM | POA: Diagnosis not present

## 2020-05-20 DIAGNOSIS — F411 Generalized anxiety disorder: Secondary | ICD-10-CM

## 2020-05-20 DIAGNOSIS — G4733 Obstructive sleep apnea (adult) (pediatric): Secondary | ICD-10-CM

## 2020-05-20 DIAGNOSIS — F330 Major depressive disorder, recurrent, mild: Secondary | ICD-10-CM

## 2020-05-20 DIAGNOSIS — F329 Major depressive disorder, single episode, unspecified: Secondary | ICD-10-CM | POA: Insufficient documentation

## 2020-05-20 NOTE — Progress Notes (Signed)
NEUROPSYCHOLOGICAL EVALUATION [place]. [facility] Department of Neurology  Date of Evaluation: May 20, 2020  Reason for Referral:   Michael Warren is a 49 y.o. right-handed Caucasian male referred by Michael Warren, M.D., to characterize his current cognitive functioning and assist with diagnostic clarity and treatment planning in the context of subjective cognitive decline following exposure to the COVID-19 virus.   Assessment and Plan:   Clinical Impression(s): Michael Warren pattern of performance is suggestive of neuropsychological functioning largely within normal limits relative to premorbid intellectual estimates and age-matched peers. Mild variability was exhibited across memory measures, primarily across encoding (i.e., learning) aspects of these tasks. While he had trouble retrieving a list of words after a delay, delayed recall across all other memory tasks was strong, thus not suggestive of an ongoing universal memory retrieval/storage deficit. Performance was within appropriate normative ranges across processing speed, attention/concentration, executive functioning, receptive and expressive language, and visuospatial abilities. Michael Warren denied difficulties completing instrumental activities of daily living (ADLs) independently.  Across mood-related questionnaires, he reported acute symptoms of moderate anxiety and mild depression. He also reported severe preoccupation with somatic (i.e., physical) symptoms, ongoing sleep difficulties, and severe fatigue. It is entirely possible that subjective day-to-day deficits experienced by Michael Warren are directly related to ongoing psychiatric distress and sleep dysfunction. He also reported a history of untreated obstructive sleep apnea which can certainly account for variability across memory testing on its own. Specific to his history of COVID-19, there have yet to be any longitudinal studies to elucidate the likelihood,  extent, and severity of persisting cognitive dysfunction stemming from prior COVID-19 infection. Current studies have theorized many different potential contributing factors and it remains unclear if deficits are due to direct CNS involvement, related to external factors (e.g., fatigue, sleep disruption, and psychiatric distress), or related to a combination of several factors. Current testing does not suggest any area of focal deficit or reason to suspect permanent brain damage. It is further reasonable to expect that subjective cognitive deficits may show improvement as mood and sleep variables are actively treated.   Recommendations: Michael Warren reported a history of obstructive sleep apnea confirmed via a prior laboratory sleep study. He reported discontinuing CPAP use due to it feeling like he was being "water-boarded." Untreated sleep apnea can certainly influence day-to-day functioning and cognitive performance, especially surrounding short-term memory. Sleep apnea will also increase his risk for heart attack, stroke, and future cognitive decline (i.e., dementia) if it remains untreated. As such, he should prioritize treating this condition and discuss additional treatment options with his medical team should he remain resistant to CPAP intervention.  A combination of medication and psychotherapy has been shown to be most effective at treating symptoms of anxiety and depression. As such, Michael Warren is encouraged to speak with his prescribing physician regarding medication adjustments to optimally manage these symptoms.   Likewise, it is recommended that Michael Warren engage in short-term psychotherapy to address symptoms of psychiatric distress. He made several statements during the interview which bolsters this recommendation, including him feeling as though he is actively grieving the loss of his pre-COVID identity. He would benefit from an active and collaborative therapeutic environment, rather than one  purely supportive in nature. Recommended treatment modalities include Cognitive Behavioral Therapy (CBT) or Acceptance and Commitment Therapy (ACT).  In addition to all recommendations made by the [facility] COVID-19 multidisciplinary team, Michael Warren is encouraged to attend to lifestyle factors for brain health (e.g., regular physical exercise, good nutrition  habits, regular participation in cognitively-stimulating activities, and general stress management techniques), which are likely to have benefits for both emotional adjustment and cognition. In fact, in addition to promoting good general health, regular exercise incorporating aerobic activities (e.g., brisk walking, jogging, cycling, etc.) has been demonstrated to be a very effective treatment for depression and stress, with similar efficacy rates to both antidepressant medication and psychotherapy. Optimal control of vascular risk factors (including safe cardiovascular exercise and adherence to dietary recommendations) is encouraged. Limiting or eliminating daily alcohol consumption will also be beneficial not only for brain health, but ongoing sleep difficulties as well.   If interested, there are some activities which have therapeutic value and can be useful in keeping him cognitively stimulated. For suggestions, Michael Warren is encouraged to go to the following website: https://www.[facility].org/get-to-know-barrow/[email or web address]-programs/neurorehabilitation-center/neuro-rehab-apps-and-games/ which has options, categorized by level of difficulty. It should be noted that these activities should not be viewed as a substitute for therapy.  When learning new information, he would benefit from information being broken up into small, manageable pieces. He may also find it helpful to articulate the material in his own words and in a context to promote encoding at the onset of a new task. This material may need to be repeated multiple times to promote encoding.  Memory can  be improved using internal strategies such as rehearsal, repetition, chunking, mnemonics, association, and imagery. External strategies such as written notes in a consistently used memory journal, visual and nonverbal auditory cues such as a calendar on the refrigerator or appointments with alarm, such as on a cell phone, can also help maximize recall.    Review of Records:   Michael Warren was seen by [facility] Levert Feinstein, M.D.) on 12/22/2019 for post-COVID changes. Briefly, Michael Warren contracted COVID-19 in February 2021, presenting with fever, headache, diffuse body achy pain, cough, and loss of sense of smell and taste. He was not hospitalized and did not require ventilation or external oxygen. COVID symptoms lasted for a few weeks and he was unable to return to his previous job. Despite some improvement, he continued to report brain fog/cognitive dysfunction, generalized weakness, frequent muscle twitching, feeling exhausted all the time, not sleeping well, and sometimes waking in the middle of the night with his whole body shaking. He stated his belief that he was not mentally or physically able to return to his previous job at that time. Performance on a brief cognitive screening instrument (MMSE) was 30/30.  Most recently, Michael Warren was seen at [facility] within their post-COVID multidisciplinary clinic on 05/04/2020. Clinical impressions and subsequent recommendations at that time included: 1. Fatigue with likely postexertional malaise component: Underwent evaluations with physical therapy and occupational therapy today. Provided education on activity pacing and energy conservation techniques. Encouraged gradual increase of activity level as tolerated, being careful to avoid physical or cognitive activity to the point of exhaustion. Patient to journal activities and symptoms over the coming weeks to determine any associations which may be present.  2. Vision impairments and  dizziness: Would benefit from functional visual training with occupational therapy and vestibular rehabilitation with a physical therapist. External referrals provided. 3. Pain: Generalized arthralgias and myalgias. May benefit from over-the-counter pain medication which has not been regularly utilized to date. Additionally, in future could consider Cymbalta. 4. Sleep disturbance: Provided education on sleep hygiene techniques. Provided prescription for trazodone 50 mg nightly as needed. 5. Cardiopulmonary: Evaluation by internal medicine today. Encouraged to utilize breath work exercises. Baseline tryptase ordered with  instructions to obtain recheck during symptom flaring. 6. Cognition: Evaluation by neuropsychology today. Considerable cognitive issues noted. Recommending full neuropsychologic testing for formal cognitive evaluation and assessment of barriers to recovery. Referral to be submitted to Rosann Auerbach, PhD in [place]. Referral for cognitive rehabilitation with speech therapy ([facility]). Trial of [name] 5 mg twice daily has been modestly effective. We will titrate to 10 mg twice daily. 7. Mental Health: Evaluation by psychiatry today. Overall coping well. Management recommendations include titration of methylphenidate as listed above. 8. 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 Work note provided today to permit rest breaks. Social worker will perform assessment and provide resources.  Ultimately, Michael Warren was referred for a comprehensive neuropsychological evaluation to characterize his cognitive abilities and to assist with diagnostic clarity and treatment planning.   Brain MRI on 02/12/2020 was unremarkable.   Past Medical History:  Diagnosis Date   Acute pericarditis    Brain fog    stemming from COVID-19   Empty sella syndrome    partial; on hormone replacement    Esophageal ulcer    Essential hypertension    Fatigue 10/30/2019   Generalized anxiety disorder    History of COVID-19 08/2019   Hypothyroidism 04/16/2019   Internal hemorrhoids 08/21/2018   Major depressive disorder    Muscle fasciculation 12/22/2019   Muscle weakness    Obstructive sleep apnea    confirmed via sleep test; no current CPAP use   Ringing in ear     Past Surgical History:  Procedure Laterality Date   COLONOSCOPY     URETEROSCOPY      Current Outpatient Medications:    amLODipine (NORVASC) 5 MG tablet, Take 5 mg by mouth daily., Disp: , Rfl:    B-D 3CC LUER-LOK SYR 21GX1-1/2 21G X 1-1/2" 3 ML MISC, AS DIRECTED FOR USE WITH TESTOSTERONE IM, Disp: , Rfl: 5   calcium carbonate (TUMS - DOSED IN MG ELEMENTAL CALCIUM) 500 MG chewable tablet, Chew 2 tablets by mouth 2 (two) times daily as needed for indigestion or heartburn., Disp: , Rfl:    Cholecalciferol (VITAMIN D3) 5000 UNITS CAPS, Take 5,000 Units by mouth daily., Disp: , Rfl:    FLOVENT HFA 110 MCG/ACT inhaler, Inhale 2 puffs into the lungs 2 (two) times daily., Disp: , Rfl:    hydrochlorothiazide (HYDRODIURIL) 25 MG tablet, Take 25 mg by mouth daily., Disp: , Rfl:    ibuprofen (ADVIL) 200 MG tablet, Take 200 mg by mouth as needed for moderate pain., Disp: , Rfl:    levothyroxine (SYNTHROID, LEVOTHROID) 125 MCG tablet, Take 125 mcg by mouth daily., Disp: , Rfl:    liothyronine (CYTOMEL) 5 MCG tablet, Take 5 mcg by mouth daily., Disp: , Rfl:    metoprolol tartrate (LOPRESSOR) 25 MG tablet, Take 1 tablet (25 mg total) by mouth 2 (two) times daily., Disp: 180 tablet, Rfl: 3   omeprazole (PRILOSEC) 40 MG capsule, Take 40 mg by mouth daily., Disp: , Rfl:    oxymetazoline (AFRIN) 0.05 % nasal spray, Place 1 spray into both nostrils daily as needed for congestion., Disp: , Rfl:    tadalafil (CIALIS) 20 MG tablet, Take 20 mg by mouth daily as needed for erectile dysfunction., Disp: , Rfl:    testosterone  cypionate (DEPOTESTOTERONE CYPIONATE) 200 MG/ML injection, Inject 0.6 mg into the muscle once a week. , Disp: , Rfl:    traZODone (DESYREL) 50 MG  tablet, Take 2 tablets (100 mg total) by mouth at bedtime., Disp: 180 tablet, Rfl: 0   valACYclovir (VALTREX) 1000 MG tablet, Take 1 tablet by mouth as needed., Disp: , Rfl:    VENTOLIN HFA 108 (90 BASE) MCG/ACT inhaler, Inhale 2 puffs into the lungs daily as needed for wheezing or shortness of breath. , Disp: , Rfl:   Clinical Interview:   The following information was obtained during a clinical interview with Michael Warren and his wife prior to cognitive testing.  Cognitive Symptoms: Decreased short-term memory: Endorsed. Michael Warren described primarily generalized symptoms of short-term memory dysfunction. When provided specific questions, he endorsed difficulties recalling the names of familiar individuals, trouble with word finding, frequently losing his train of thought, and forgetting the task at hand while in the midst of performing it.  Decreased long-term memory: Denied. Decreased attention/concentration: Endorsed. Deficits were primarily localized to deficits in sustained attention. He noted that he is able to stay focused to an extent. However, once that threshold is breached, he is unable to focus any longer and "just stops." He generally denied trouble with increased distractibility.  Reduced processing speed: Endorsed. Difficulties with executive functions: Endorsed. He acknowledged deficits with organization, complex planning, and indecision. He was unclear when asked about multi-tasking, originally denying concerns in this area but then describing difficulties in his day-to-day life. Trouble with impulsivity and pronounced personality changes were denied.  Difficulties with emotion regulation: Denied. Difficulties with receptive language: Endorsed. Deficits were largely attributed to deficits in processing speed.  Difficulties with word  finding: Endorsed. His wife reported that he will frequently substitute incorrect words while speaking to others. He also noted that his articulation has slowed, potentially due to processing speed concerns.  Decreased visuoperceptual ability: Endorsed. He reported trouble with depth perception in the form of being unable to judge the speed of coming or going objects in his environment. He additionally reported bumping into things while ambulating, attributed to ongoing balance concerns.   Trajectory of deficits: Cognitive deficits were said to have been present since contracting COVID-19 in February 2021. When asked about the severity of his illness, his wife commented that "we are just happy that he is alive." However, he was not hospitalized and did not require ventilation or external oxygen supplementation. Deficits were said to have somewhat improved since February but he stressed that he does not feel back to his normal self. Cognitive concerns prior to contracting COVID were denied.   Difficulties completing ADLs: His wife manages personal finances; however, this has been ongoing for some time. He reported being generally able to manage his pillbox/medications. Despite this, he did report some instances where he will be unsure if he took his medications earlier in the day. While he does not enjoy the act of driving any longer, he is able to perform this activity without significant issue.   Additional Medical History: History of traumatic brain injury/concussion: Denied. History of stroke: Denied. History of seizure activity: Denied. History of known exposure to toxins: Denied. Symptoms of chronic pain: Endorsed. Prior to COVID, he acknowledged diffuse body pain with largely manageable symptoms. However, pain symptoms have been exacerbated since contracting this illness. He denied current pain-alleviating medications.  Experience of frequent headaches/migraines: Endorsed. Historically, he  reported a history of severe headache symptoms prior to COVID. More recently, he denied the presence of severe symptoms but did endorse a daily dull ache.  Frequent instances of dizziness/vertigo: Endorsed. He reported ongoing trouble with feeling lightheaded and dizzy which  likely contributes to balance concerns (see below). Additionally, when asked this question, he further responded with statements surrounding him feeling as though he is "nodding off" while walking like the "power gets shut off." Symptoms were said to be unilateal and cary from side to side.   Sensory changes: He wears glasses with positive effect. Sensory changes in taste and smell were reported as a part of his COVID-19 infection. Hearing loss was denied.  Balance/coordination difficulties: Endorsed. He reported feeling unsteady on his feet and will often require external stabilization (e.g., wall or piece of furniture) while ambulating. Symptoms of lightheadedness and feeling dizzy were also reported. Despite this, he denied a history of falls. Vestibular therapy was previously recommended to address balance concerns.  Other motor difficulties: Endorsed. He reported ongoing muscle fasciculations and very mild tremulous behaviors in his upper extremities. The latter were described to occur sporadically as a result of experienced "electrical storms" within his body.   Sleep History: Estimated hours obtained each night: He was unable to estimate the number of hours he sleeps at night. Later during the interview, he commented feeling as thought it would be better to assess this in terms of minutes rather than hours. His wife reported that she witnesses him sleeping for larger spurts throughout the night despite his report.  Difficulties falling asleep: Endorsed. However, these are mitigated by taking melatonin.  Difficulties staying asleep: Endorsed. These are also mitigated by taking melatonin. Specifically, he noted that melatonin  seems to keep him groggy so that when he does wake up during the night, he is able to fall back asleep more easily.  Feels rested and refreshed upon awakening: Denied.  History of snoring: Denied. History of waking up gasping for air: Infrequent symptoms were reported since having COVID. Symptoms were denied prior to this.  Witnessed breath cessation while asleep: Denied. Despite this, Mr. Rosenberry and his wife reported that he previously underwent a formal laboratory sleep study and was diagnosed with obstructive sleep apnea. He reported prior CPAP intervention but could not tolerate wearing the mask ("It felt like being water-boarded'). As such, this condition appears untreated at the present time.   History of vivid dreaming: Denied. Excessive movement while asleep: Endorsed. His wife described a few instances since COVID where he has swung his arms while asleep. However, this was said to be very rare.  Instances of acting out his dreams: Denied.  Psychiatric/Behavioral Health History: Depression: Depressive symptoms prior to contracting COVID were largely denied. Since then, he described symptoms of grief, stating that he has been "getting used to being who I am now" and stating that the "person I was is gone." He described his current mood as "okay, better than it was" overall. Current suicidal ideation, intent, or plan was denied.   Anxiety: Generalized anxiety symptoms were said to be present prior to contracting COVID, largely attributed to job-related stress. These were said to have been exacerbated by his illness and his wife described them having a subjectively traumatic experience while being ill with the virus.  Mania: Denied. Trauma History: Denied. Visual/auditory hallucinations: Denied. Delusional thoughts: Denied.  Tobacco: Denied. Alcohol: He reported consuming 1-3 beers per day on average. A history of problematic alcohol abuse or dependence was denied.  Recreational drugs:  Denied. Caffeine: He reported significant caffeine intake in the form of coffee in the morning. However, he described limiting caffeine after 12:00pm in order to help with sleep efforts.   Family History: Problem Relation Age of Onset  Goiter Mother    Other Mother        hx of pituitary tumor   Other Father        mitochondrial syndrome   Coronary artery disease Maternal Grandfather    Coronary artery disease Maternal Grandmother    This information was confirmed by Mr. Carn.  Academic/Vocational History: Highest level of educational attainment: 16 years. He graduated from high school and earned a Oncologist in business administration from [employer] He described himself as an average (B/C) student overall. His wife stated that he had a very poor GPA in early semesters as he preferred to engage socially. However, when pressured, he was able to perform very well in academic settings. Math was reported as a relative weakness.  History of developmental delay: Denied. History of grade repetition: Denied. Enrollment in special education courses: Denied. History of LD/ADHD: Denied.  Employment: He currently works as a Midwife for [employer].   Evaluation Results:   Behavioral Observations: Michael Warren was accompanied by his wife, arrived to his appointment on time, and was appropriately dressed and groomed. He appeared alert and oriented. He ambulated slowly but I did not observe any significant gait abnormalities. Gross motor functioning appeared intact upon informal observation and no abnormal movements (e.g., tremors) were noted. His affect was generally relaxed and positive, but did range appropriately given the subject being discussed during the clinical interview or the task at hand during testing procedures. Spontaneous speech was fluent. Very mild word finding difficulties were observed during the clinical interview. Thought processes were coherent, organized,  and normal in content. Insight into his cognitive difficulties appeared adequate. During testing, sustained attention was appropriate. Task engagement was adequate and he persisted when challenged. Overall, Mr. Ciavarella was cooperative with the clinical interview and subsequent testing procedures.   Adequacy of Effort: The validity of neuropsychological testing is limited by the extent to which the individual being tested may be assumed to have exerted adequate effort during testing. Michael Warren expressed his intention to perform to the best of his abilities and exhibited adequate task engagement and persistence. Scores across stand-alone and embedded performance validity measures were within expectation. As such, the results of the current evaluation are believed to be a valid representation of Michael Warren current cognitive functioning.  Test Results: Michael Warren was fully oriented at the time of the current evaluation.  Intellectual abilities based upon educational and vocational attainment were estimated to be in the average range. Premorbid abilities were estimated to be within the average range based upon a single-word reading test.   Processing speed was average to above average. Basic attention was average. More complex attention (e.g., working memory) was above average. Executive functioning was largely average to above average. However, he did exhibit an isolated weakness across a visuomotor cognitive flexibility task (TMT B).  Assessed receptive language abilities were average. Likewise, Michael Warren did not exhibit any difficulties comprehending task instructions and answered all questions asked of him appropriately. Assessed expressive language was mildly variable. Phonemic fluency was below average to average, semantic fluency was below average, and confrontation naming was average     Assessed visuospatial/visuoconstructional abilities were average to exceptionally high.    Learning (i.e.,  encoding) of novel verbal and visual information was variable, ranging from the below average to above average normative ranges. Spontaneous delayed recall (i.e., retrieval) of previously learned information was exceptionally low to below average across a list learning task but average to above average across all  other memory tasks. Retention rates were 92% across a story learning task, 150% across a list learning task, and 67% across a shape learning task. Performance across recognition tasks was average to above average, suggesting evidence for information consolidation.   Results of emotional screening instruments suggested that recent symptoms of generalized anxiety were in the moderate range, while symptoms of depression were within the mild range. He reported a severe preoccupation with somatic (i.e., physical) symptoms. A screening instrument assessing recent sleep quality suggested the presence of moderate sleep dysfunction. He also elevated a fatigue severity scale.   Tables of Scores:   Note: This summary of test scores accompanies the interpretive report and should not be considered in isolation without reference to the appropriate sections in the text. Descriptors are based on appropriate normative data and may be adjusted based on clinical judgment. The terms impaired and within normal limits (WNL) are used when a more specific level of functioning cannot be determined.       Effort Testing:   DESCRIPTOR       ACS Word Choice: --- --- Within Expectation  Dot Counting Test: --- --- Within Expectation  NAB EVI: --- --- Within Expectation  D-KEFS Color Word Effort Index: --- --- Within Expectation       Orientation:      Raw Score Percentile   NAB Orientation, Form 1 29/29 --- ---       Cognitive Screening:           Raw Score Percentile   SLUMS: 26/30 --- ---       Intellectual Functioning:           Standard Score Percentile   Test of Premorbid Functioning: 102 55 Average         Memory:          NAB Memory Module, Form 2: T Score Percentile   List Learning       Total Trials 1-3 20/36 (39) 14 Below Average    List B 4/12 (44) 27 Average    Short Delay Free Recall 4/12 (27) 1 Exceptionally Low    Long Delay Free Recall 6/12 (38) 12 Below Average    Retention Percentage 150 (71) 98 Exceptionally High    Recognition Discriminability 10 (53) 62 Average  Shape Learning       Total Trials 1-3 22/27 (63) 91 Well Above Average    Delayed Recall 6/9 (48) 42 Average    Retention Percentage 67 (40) 16 Below Average    Recognition Discriminability 9 (59) 82 Above Average  Story Learning       Immediate Recall 41/80 (42) 21 Below Average    Delayed Recall 23/40 (45) 31 Average    Retention Percentage 92 (52) 58 Average  Daily Living Memory       Immediate Recall 47/51 (57) 75 Above Average    Delayed Recall 17/17 (59) 82 Above Average    Retention Percentage 100 (57) 75 Above Average    Recognition Hits 10/10 (58) 79 Above Average       Attention/Executive Function:          Trail Making Test (TMT): Raw Score (T Score) Percentile     Part A 32 secs.,  1 error (44) 27 Average    Part B 91 secs.,  0 errors (36) 8 Well Below Average         Scaled Score Percentile   WAIS-IV Coding: 12 75 Above Average  NAB Attention Module, Form 1: T Score Percentile     Digits Forward 56 73 Average    Digits Backwards 59 82 Above Average       D-KEFS Color-Word Interference Test: Raw Score (Scaled Score) Percentile     Color Naming 30 secs. (10) 50 Average    Word Reading 22 secs. (11) 63 Average    Inhibition 51 secs. (12) 75 Above Average      Total Errors 0 errors (12) 75 Above Average    Inhibition/Switching 66 secs. (11) 63 Average      Total Errors 2 errors (10) 50 Average       D-KEFS Verbal Fluency Test: Raw Score (Scaled Score) Percentile     Letter Total Correct 37 (10) 50 Average    Category Total Correct 29 (6) 9 Below Average    Category  Switching Total Correct 14 (11) 63 Average    Category Switching Accuracy 13 (11) 63 Average      Total Set Loss Errors 1 (11) 63 Average      Total Repetition Errors 1 (12) 75 Above Average       D-KEFS 20 Questions Test: Scaled Score Percentile     Total Weighted Achievement Score 13 84 Above Average    Initial Abstraction Score 18 >99 Exceptionally High       [place] Card Sorting Test: Raw Score Percentile     Categories (trials) 5 (64) >16 Within Normal Limits    Total Errors 9 75 Above Average    Perseverative Errors 4 62 Average    Non-Perseverative Errors 5 46 Average    Failure to Maintain Set 0 --- ---       Language:          Verbal Fluency Test: Raw Score (T Score) Percentile     Phonemic Fluency (FAS) 37 (42) 21 Below Average    Animal Fluency 16 (37) 9 Below Average        NAB Language Module, Form 1: T Score Percentile     Auditory Comprehension 54 66 Average    Naming 31/31 (52) 58 Average       Visuospatial/Visuoconstruction:      Raw Score Percentile   Clock Drawing: 10/10 --- Within Normal Limits       NAB Spatial Module, Form 1: T Score Percentile     Figure Drawing Copy 71 98 Exceptionally High    Figure Drawing Immediate Recall 65 93 Well Above Average        Scaled Score Percentile   WAIS-IV Block Design: 13 84 Above Average  WAIS-IV Matrix Reasoning: 10 50 Average       Mood and Personality:      Raw Score Percentile   Beck Depression Inventory - II: 17 --- Mild  PROMIS Anxiety Questionnaire: 22 --- Moderate       Additional Questionnaires:      Raw Score Percentile   PHQ-15: 21 --- Severe  PROMIS Sleep Disturbance Questionnaire: 34 --- Moderate  Fatigue Severity Scale: 63 --- Elevated   Informed Consent and Coding/Compliance:   Mr. Placencia was provided with a verbal description of the nature and purpose of the present neuropsychological evaluation. Also reviewed were the foreseeable risks and/or discomforts and benefits of the procedure,  limits of confidentiality, and mandatory reporting requirements of this provider. The patient was given the opportunity to ask questions and receive answers about the evaluation. Oral consent to participate was provided by the patient.   This evaluation  was conducted by Newman Nickels, Ph.D., licensed clinical neuropsychologist. Mr. Stender completed a comprehensive clinical interview with Dr. Milbert Coulter, billed as one unit [phone], and 160 minutes of cognitive testing and scoring, billed as one unit [phone] and four additional units [id number]. Psychometrist Wallace Keller, B.S., assisted Dr. Milbert Coulter with test administration and scoring procedures. As a separate and discrete service, Dr. Milbert Coulter spent a total of 160 minutes in interpretation and report writing billed as one unit [phone] and two units [id number].

## 2020-05-20 NOTE — Progress Notes (Signed)
   Psychometrician Note   Cognitive testing was administered to Michael Warren by Michael Warren, B.S. (psychometrist) under the supervision of Dr. Newman Nickels, Ph.D., licensed psychologist on 05/20/20. Michael Warren did not appear overtly distressed by the testing session per behavioral observation or responses across self-report questionnaires. Dr. Newman Nickels, Ph.D. checked in with Michael Warren as needed to manage any distress related to testing procedures (if applicable). Rest breaks were offered.    The battery of tests administered was selected by Dr. Newman Nickels, Ph.D. with consideration to Michael Warren's current level of functioning, the nature of his symptoms, emotional and behavioral responses during interview, level of literacy, observed level of motivation/effort, and the nature of the referral question. This battery was communicated to the psychometrist. Communication between Dr. Newman Nickels, Ph.D. and the psychometrist was ongoing throughout the evaluation and Dr. Newman Nickels, Ph.D. was immediately accessible at all times. Dr. Newman Nickels, Ph.D. provided supervision to the psychometrist on the date of this service to the extent necessary to assure the quality of all services provided.    Michael Warren will return within approximately 1-2 weeks for an interactive feedback session with Dr. [name] at which time his test performances, clinical impressions, and treatment recommendations will be reviewed in detail. Michael Warren understands he can contact our office should he require our assistance before this time.  A total of 160 minutes of billable time were spent face-to-face with Michael Warren by the psychometrist. This includes both test administration and scoring time. Billing for these services is reflected in the clinical report generated by Dr. Newman Nickels, Ph.D..  This note reflects time spent with the psychometrician and does not include test scores or any clinical  interpretations made by Dr. [name]. The full report will follow in a separate note.

## 2020-05-21 ENCOUNTER — Encounter: Payer: Self-pay | Admitting: Physical Therapy

## 2020-05-21 ENCOUNTER — Ambulatory Visit: Payer: 59 | Attending: *Deleted | Admitting: Physical Therapy

## 2020-05-21 DIAGNOSIS — R2689 Other abnormalities of gait and mobility: Secondary | ICD-10-CM | POA: Diagnosis present

## 2020-05-21 DIAGNOSIS — R2681 Unsteadiness on feet: Secondary | ICD-10-CM | POA: Insufficient documentation

## 2020-05-21 DIAGNOSIS — R42 Dizziness and giddiness: Secondary | ICD-10-CM | POA: Diagnosis present

## 2020-05-21 DIAGNOSIS — R262 Difficulty in walking, not elsewhere classified: Secondary | ICD-10-CM | POA: Diagnosis present

## 2020-05-21 DIAGNOSIS — R41844 Frontal lobe and executive function deficit: Secondary | ICD-10-CM | POA: Insufficient documentation

## 2020-05-21 DIAGNOSIS — R41841 Cognitive communication deficit: Secondary | ICD-10-CM | POA: Diagnosis present

## 2020-05-21 DIAGNOSIS — R41840 Attention and concentration deficit: Secondary | ICD-10-CM | POA: Insufficient documentation

## 2020-05-21 DIAGNOSIS — R41842 Visuospatial deficit: Secondary | ICD-10-CM | POA: Insufficient documentation

## 2020-05-21 NOTE — Therapy (Signed)
[facility] [address] [place], [place], [address] Phone: [phone]   Fax:  [phone]  Physical Therapy Evaluation  Patient Details  Name: Michael Warren MRN: [id number] Date of Birth: [birth year] Referring Provider (PT): Hope Pigeon, MD   Encounter Date: 05/21/2020   PT End of Session - 05/21/20 0935    Visit Number 1    Number of Visits 7    Date for PT Re-Evaluation 07/02/20    Authorization Type [employer]    PT Start Time 0845    PT Stop Time 0935    PT Time Calculation (min) 50 min    Activity Tolerance Patient tolerated treatment well    Behavior During Therapy [facility] for tasks assessed/performed           Past Medical History:  Diagnosis Date  . Acute pericarditis   . Brain fog    stemming from COVID-19  . Empty sella syndrome    partial; on hormone replacement  . Esophageal ulcer   . Essential hypertension   . Fatigue 10/30/2019  . Generalized anxiety disorder   . History of COVID-19 08/2019  . Hypothyroidism 04/16/2019  . Internal hemorrhoids 08/21/2018  . Major depressive disorder   . Muscle fasciculation 12/22/2019  . Muscle weakness   . Obstructive sleep apnea    confirmed via sleep test; no current CPAP use  . Ringing in ear     Past Surgical History:  Procedure Laterality Date  . COLONOSCOPY    . URETEROSCOPY      There were no vitals filed for this visit.    Subjective Assessment - 05/21/20 0853    Subjective Pt reports he had COVID in Feb 2021. Pt states he has dizziness going on -- not a spinning sensation. Feels that he is tipsy "a little bit drunk." It is constant. Pt reports ringing in his ear (R>L). Has not seen ENT or audiologist for this. Pt reports increased LOB and stumbles (no falls). Pt states he will have sensation while standing or laying down.    Patient Stated Goals Improve unsteadiness    Currently in Pain? No/denies              [facility] PT Assessment - 05/21/20  0001      Assessment   Medical Diagnosis Post-COVID; hx of COVID Feb 2021    Referring Provider (PT) Hope Pigeon, MD      Balance Screen   Has the patient fallen in the past 6 months No      Home Environment   Living Environment Private residence    Living Arrangements Spouse/significant other    Type of Home House    Home Access Stairs to enter    Home Layout Two level    Alternate Level Stairs-Number of Steps 20   avoids stairs at work otherwise   Alternate Level Stairs-Rails Left   going down   Home Equipment None      Prior Function   Level of Independence Independent    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    Leisure weight lifting/gym      Observation/Other Assessments   Focus on Therapeutic Outcomes (FOTO)  45%    Other Surveys  Dizziness Handicap Inventory (DHI)    Dizziness Handicap Inventory (DHI)  86      Ambulation/Gait   Assistive device None    Gait Pattern Step-through pattern;Decreased stride length;Decreased weight shift to  right;Decreased weight shift to left;Lateral trunk lean to right;Wide base of support    Ambulation Surface Level;Indoor    Gait velocity 13.15 = 2.5 ft/sec    Stairs Yes    Stairs Assistance 4: Min guard    Stair Management Technique Alternating pattern    Number of Stairs 4    Height of Stairs 6      Functional Gait  Assessment   Gait assessed  Yes    Gait Level Surface Walks 20 ft in less than 7 sec but greater than 5.5 sec, uses assistive device, slower speed, mild gait deviations, or deviates 6-10 in outside of the 12 in walkway width.    Change in Gait Speed Able to change speed, demonstrates mild gait deviations, deviates 6-10 in outside of the 12 in walkway width, or no gait deviations, unable to achieve a major change in velocity, or uses a change in velocity, or uses an assistive device.    Gait with Horizontal Head Turns Performs head turns smoothly with slight  change in gait velocity (eg, minor disruption to smooth gait path), deviates 6-10 in outside 12 in walkway width, or uses an assistive device.    Gait with Vertical Head Turns Performs head turns with no change in gait. Deviates no more than 6 in outside 12 in walkway width.    Gait and Pivot Turn Pivot turns safely in greater than 3 sec and stops with no loss of balance, or pivot turns safely within 3 sec and stops with mild imbalance, requires small steps to catch balance.    Step Over Obstacle Is able to step over 2 stacked shoe boxes taped together (9 in total height) without changing gait speed. No evidence of imbalance.    Gait with Narrow Base of Support Is able to ambulate for 10 steps heel to toe with no staggering.    Gait with Eyes Closed Walks 20 ft, uses assistive device, slower speed, mild gait deviations, deviates 6-10 in outside 12 in walkway width. Ambulates 20 ft in less than 9 sec but greater than 7 sec.    Ambulating Backwards Walks 20 ft, uses assistive device, slower speed, mild gait deviations, deviates 6-10 in outside 12 in walkway width.    Steps Alternating feet, no rail.    Total Score 24                  Vestibular Assessment - 05/21/20 0001      Symptom Behavior   Subjective history of current problem Happened after COVID    Type of Dizziness  Imbalance   "drunk" and "tipsy"   Frequency of Dizziness constant    Duration of Dizziness always present    Symptom Nature Constant;Motion provoked   Notice more with movement   Aggravating Factors Walking in a crowd;Turning body quickly    Relieving Factors Slow movements    Progression of Symptoms No change since onset   except now happens in his sleep     Oculomotor Exam   Ocular ROM WFL    Spontaneous Absent    Gaze-induced  Absent    Head shaking Horizontal Absent    Head Shaking Vertical Absent    Smooth Pursuits Intact    Saccades Intact      Vestibulo-Ocular Reflex   VOR 1 Head Only (x 1 viewing)  WFL    VOR 2 Head and Object (x 2 viewing) --   mild increase in symptoms   VOR to Slow Head  Movement Normal;Comment   mild increase in symptoms   VOR Cancellation --   Mild increase in symptoms     Visual Acuity   Static 10   without glasses   Dynamic 10      Positional Sensitivities   Sit to Supine Lightheadedness    Supine to Left Side Lightheadedness    Supine to Right Side Lightheadedness    Supine to Sitting Moderate dizziness    Right Hallpike Lightheadedness    Up from Right Hallpike Lightheadedness    Up from Left Hallpike Lightheadedness    Nose to Right Knee Lightheadedness    Right Knee to Sitting Moderate dizziness    Nose to Left Knee Lightheadedness    Left Knee to Sitting Moderate dizziness    Head Turning x 5 Lightheadedness    Head Nodding x 5 Lightheadedness    Pivot Right in Standing Moderate dizziness    Pivot Left in Standing Moderate dizziness    Rolling Right Mild dizziness    Rolling Left Mild dizziness    Positional Sensitivities Comments sit<>stand 2/5              Objective measurements completed on examination: See above findings.                 PT Short Term Goals - 05/21/20 1715      PT SHORT TERM GOAL #1   Title PT will perform 6 MWT to create goal for endurance in the next 1-2 visits    Time 2    Period Weeks    Status New    Target Date 06/04/20             PT Long Term Goals - 05/21/20 1713      PT LONG TERM GOAL #1   Title Pt will be independent with performing HEP for endurance and balance    Time 6    Period Weeks    Status New    Target Date 07/02/20      PT LONG TERM GOAL #2   Title Pt will have improved FGA to >/= 28/30    Baseline 24/30 on eval (05/21/20)    Time 6    Period Weeks    Status New    Target Date 07/02/20      PT LONG TERM GOAL #3   Title Pt will report symptoms </=1/5 when checking motion sensitivities    Baseline at most 3/5 in certain positions    Time 6    Period Weeks     Status New    Target Date 07/02/20      PT LONG TERM GOAL #4   Title Pt will have improved DHI to </=60/100 to decrease to moderate severity    Baseline 86/100 currently rated as severe    Time 6    Period Weeks    Status New    Target Date 07/02/20      PT LONG TERM GOAL #5   Title Pt will have improved FOTO score to >/= 68%    Baseline 45% score at eval (05/21/20)    Time 6    Period Weeks    Status New    Target Date 07/02/20                  Plan - 05/21/20 1707    Clinical Impression Statement Mr. Flannagan is a 49 y/o M presenting to OPPT with s/s of post-COVID since Feb 2021. Pt with  post-Covid fatigue, brain fog, decreased balance (as demonstrated with his FGA and gait speed), and increased motion sensitivities affecting pt's ability to perform his regular work tasks and interrupting comfort with home tasks. Pt would benefit from therapy to address these issues and optimize his level of function.    Personal Factors and Comorbidities Age    Examination-Activity Limitations Locomotion Level;Squat;Stairs;Lift;Bend    Examination-Participation Restrictions Interpersonal Relationship;Occupation;Cleaning;Yard Work;Community Activity    Stability/Clinical Decision Making Evolving/Moderate complexity    Clinical Decision Making Moderate    Rehab Potential Good    PT Frequency 1x / week    PT Duration 6 weeks    PT Treatment/Interventions ADLs/Self Care Home Management;Electrical Stimulation;Moist Heat;Iontophoresis 4mg /ml Dexamethasone;Cryotherapy;DME Instruction;Gait training;Stair training;Functional mobility training;Therapeutic activities;Therapeutic exercise;Balance training;Neuromuscular re-education;Patient/family education;Manual techniques;Energy conservation;Taping;Vestibular    PT Next Visit Plan Assess response to [employer] exercises to begin work on his motion sensitivities. Perform orthostatics. Initiate advanced balance/vestibular program. Perform 6 MWT to  assess pt's endurance.    PT Home Exercise Plan [employer] exercises in supine and sitting    Consulted and Agree with Plan of Care Patient           Patient will benefit from skilled therapeutic intervention in order to improve the following deficits and impairments:  Decreased balance, Decreased endurance, Decreased mobility, Difficulty walking, Dizziness, Decreased activity tolerance  Visit Diagnosis: Dizziness and giddiness  Unsteadiness on feet  Other abnormalities of gait and mobility  Difficulty in walking, not elsewhere classified     Problem List Patient Active Problem List   Diagnosis Date Noted  . Obstructive sleep apnea   . Generalized anxiety disorder   . Major depressive disorder   . Weakness 12/22/2019  . Muscle fasciculation 12/22/2019  . Fatigue 10/30/2019  . History of COVID-19 08/2019  . Hypothyroidism 04/16/2019  . Internal hemorrhoids 08/21/2018  . Acute pericarditis   . Globus sensation   . Essential hypertension   . Empty sella syndrome   . Chest pain with low risk for cardiac etiology 12/22/2015    [facility] CenterGellen April Randalyn RheaMa L [place] PT, DPT 05/21/2020, 5:22 PM  [facility] [address] [place], [place], [address] Phone: [phone]   Fax:  [phone]  Name: Alto Denverrennon Gouveia MRN: [id number] Date of Birth: 09/22/1970

## 2020-05-27 ENCOUNTER — Other Ambulatory Visit: Payer: Self-pay

## 2020-05-27 ENCOUNTER — Ambulatory Visit (INDEPENDENT_AMBULATORY_CARE_PROVIDER_SITE_OTHER): Payer: 59 | Admitting: Psychology

## 2020-05-27 DIAGNOSIS — F330 Major depressive disorder, recurrent, mild: Secondary | ICD-10-CM

## 2020-05-27 DIAGNOSIS — R4189 Other symptoms and signs involving cognitive functions and awareness: Secondary | ICD-10-CM

## 2020-05-27 DIAGNOSIS — F411 Generalized anxiety disorder: Secondary | ICD-10-CM

## 2020-05-27 DIAGNOSIS — Z8616 Personal history of COVID-19: Secondary | ICD-10-CM

## 2020-05-27 DIAGNOSIS — G4733 Obstructive sleep apnea (adult) (pediatric): Secondary | ICD-10-CM

## 2020-05-27 NOTE — Progress Notes (Signed)
   Neuropsychology Feedback Session Michael Warren. [facility] Department of Neurology  Reason for Referral:   Michael Warren a 49 y.o. right-handed Caucasian male referred by Hope Pigeon, M.D.,to characterize hiscurrent cognitive functioning and assist with diagnostic clarity and treatment planning in the context of subjective cognitive decline following exposure to the COVID-19 virus.   Feedback:   Michael Warren completed a comprehensive neuropsychological evaluation on 05/20/2020. Please refer to that encounter for the full report and recommendations. Briefly, results suggested neuropsychological functioning largely within normal limits relative to premorbid intellectual estimates and age-matched peers. Mild variability was exhibited across memory measures, primarily across encoding (i.e., learning) aspects of these tasks. While he had trouble retrieving a list of words after a delay, delayed recall across all other memory tasks was strong, thus not suggestive of an ongoing universal memory retrieval/storage deficit. Across mood-related questionnaires, he reported acute symptoms of moderate anxiety and mild depression. He also reported severe preoccupation with somatic (i.e., physical) symptoms, ongoing sleep difficulties, and severe fatigue. It is entirely possible that subjective day-to-day deficits experienced by Michael Warren are directly related to ongoing psychiatric distress and sleep dysfunction. He also reported a history of untreated obstructive sleep apnea which can certainly account for variability across memory testing on its own. Specific to his history of COVID-19, there have yet to be any longitudinal studies to elucidate the likelihood, extent, and severity of persisting cognitive dysfunction stemming from prior COVID-19 infection. Current testing does not suggest any area of focal deficit or reason to suspect permanent brain damage.  Michael Warren was unaccompanied during the  current feedback session. Content of the current session focused on the results of his neuropsychological evaluation. Michael Warren was given the opportunity to ask questions and his questions were answered. He did appear discouraged by the results to the extent that he has greater subjective concerns relative than what testing revealed. He also expressed concerns that medical professionals would write off his concerns as related to sleep apnea. He was encouraged to reach out should additional questions arise. A copy of his report was provided at the conclusion of the visit.      20 minutes were spent conducting the current feedback session with Michael Warren, billed as one unit [phone].

## 2020-05-27 NOTE — Patient Instructions (Signed)
Michael Warren reported a history of obstructive sleep apnea confirmed via a prior laboratory sleep study. He reported discontinuing CPAP use due to it feeling like he was being "water-boarded." Untreated sleep apnea can certainly influence day-to-day functioning and cognitive performance, especially surrounding short-term memory. Sleep apnea will also increase his risk for heart attack, stroke, and future cognitive decline (i.e., dementia) if it remains untreated. As such, he should prioritize treating this condition and discuss additional treatment options with his medical team should he remain resistant to CPAP intervention.  A combination of medication and psychotherapy has been shown to be most effective at treating symptoms of anxiety and depression. As such, Michael Warren is encouraged to speak with his prescribing physician regarding medication adjustments to optimally manage these symptoms.   Likewise, it is recommended that Michael Warren engage in short-term psychotherapy to address symptoms of psychiatric distress. He made several statements during the interview which bolsters this recommendation, including him feeling as though he is actively grieving the loss of his pre-COVID identity. He would benefit from an active and collaborative therapeutic environment, rather than one purely supportive in nature. Recommended treatment modalities include Cognitive Behavioral Therapy (CBT) or Acceptance and Commitment Therapy (ACT).  In addition to all recommendations made by the [facility] COVID-19 multidisciplinary team, Michael Warren is encouraged to attend to lifestyle factors for brain health (e.g., regular physical exercise, good nutrition habits, regular participation in cognitively-stimulating activities, and general stress management techniques), which are likely to have benefits for both emotional adjustment and cognition. In fact, in addition to promoting good general health, regular exercise incorporating aerobic  activities (e.g., brisk walking, jogging, cycling, etc.) has been demonstrated to be a very effective treatment for depression and stress, with similar efficacy rates to both antidepressant medication and psychotherapy. Optimal control of vascular risk factors (including safe cardiovascular exercise and adherence to dietary recommendations) is encouraged. Limiting or eliminating daily alcohol consumption will also be beneficial not only for brain health, but ongoing sleep difficulties as well.   If interested, there are some activities which have therapeutic value and can be useful in keeping him cognitively stimulated. For suggestions, Michael Warren is encouraged to go to the following website: https://www.[facility].org/get-to-know-barrow/centers-programs/neurorehabilitation-center/neuro-rehab-apps-and-games/ which has options, categorized by level of difficulty. It should be noted that these activities should not be viewed as a substitute for therapy.  When learning new information, he would benefit from information being broken up into small, manageable pieces. He may also find it helpful to articulate the material in his own words and in a context to promote encoding at the onset of a new task. This material may need to be repeated multiple times to promote encoding.  Memory can be improved using internal strategies such as rehearsal, repetition, chunking, mnemonics, association, and imagery. External strategies such as written notes in a consistently used memory journal, visual and nonverbal auditory cues such as a calendar on the refrigerator or appointments with alarm, such as on a cell phone, can also help maximize recall.

## 2020-05-28 ENCOUNTER — Ambulatory Visit: Payer: 59

## 2020-05-28 DIAGNOSIS — R42 Dizziness and giddiness: Secondary | ICD-10-CM | POA: Diagnosis not present

## 2020-05-28 DIAGNOSIS — R2689 Other abnormalities of gait and mobility: Secondary | ICD-10-CM

## 2020-05-28 DIAGNOSIS — R262 Difficulty in walking, not elsewhere classified: Secondary | ICD-10-CM

## 2020-05-28 DIAGNOSIS — R2681 Unsteadiness on feet: Secondary | ICD-10-CM

## 2020-05-28 NOTE — Therapy (Signed)
[facility] [address] [place], [place], [address] Phone: [phone]   Fax:  [phone]  Physical Therapy Treatment  Patient Details  Name: Michael Warren MRN: [id number] Date of Birth: 08-27-1970 Referring Provider (PT): Hope Pigeon, MD   Encounter Date: 05/28/2020   PT End of Session - 05/28/20 0806    Visit Number 2    Number of Visits 7    Date for PT Re-Evaluation 07/02/20    Authorization Type UHC    PT Start Time 0805   pt arriving late   PT Stop Time 0845    PT Time Calculation (min) 40 min    Activity Tolerance Patient tolerated treatment well    Behavior During Therapy [facility] for tasks assessed/performed           Past Medical History:  Diagnosis Date  . Acute pericarditis   . Brain fog    stemming from COVID-19  . Empty sella syndrome    partial; on hormone replacement  . Esophageal ulcer   . Essential hypertension   . Fatigue 10/30/2019  . Generalized anxiety disorder   . History of COVID-19 08/2019  . Hypothyroidism 04/16/2019  . Internal hemorrhoids 08/21/2018  . Major depressive disorder   . Muscle fasciculation 12/22/2019  . Muscle weakness   . Obstructive sleep apnea    confirmed via sleep test; no current CPAP use  . Ringing in ear     Past Surgical History:  Procedure Laterality Date  . COLONOSCOPY    . URETEROSCOPY      There were no vitals filed for this visit.   Subjective Assessment - 05/28/20 0807    Subjective Patietn reports no changes since last visit. Reports that he has noticed moving faster excerbates the symptoms. No falls, but does have stumbles.    Patient Stated Goals Improve unsteadiness    Currently in Pain? Yes    Pain Score --   3 1/2   Pain Location Generalized    Pain Orientation --   all over   Pain Descriptors / Indicators Aching    Pain Type Chronic pain              Vestibular Assessment - 05/28/20 0001      Oculomotor Exam-Fixation  Suppressed    Left Head Impulse No Compensatory Saccade Noted    Right Head Impulse No Compensatory saccade noted      Orthostatics   BP supine (x 5 minutes) 143/94    HR supine (x 5 minutes) 79    BP sitting 144/99   increased symptoms from supine <> sit   HR sitting 77    BP standing (after 1 minute) 139/101    HR standing (after 1 minute) 85    Orthostatics Comment most symptoms with positional change from supine <> sit; 142/92 after seated rest. patietn did report feeling very stressed this morning on his ride to session reports this is why BP could be elevated.              [facility] Adult PT Treatment/Exercise - 05/28/20 0001      Ambulation/Gait   Ambulation/Gait Yes    Ambulation/Gait Assistance 5: Supervision    Ambulation/Gait Assistance Details ambulating into/out of therapy session    Assistive device None    Gait Pattern Step-through pattern;Decreased stride length;Decreased weight shift to right;Decreased weight shift to left;Lateral trunk lean to right;Wide base of support    Ambulation Surface Level;Indoor  Therapeutic Activites    Therapeutic Activities Other Therapeutic Activities    Other Therapeutic Activities Completed habituation exercises including Nose to R/L Knee, 2 x 5 reps each direction. Increased symptoms with coming back up. Patient reporting increase in symptoms 4-5/10 dizziness. Rest breaks required to allow for symptom reduced. PT educating with HEP to continue to monitor symptoms and avoid getting dizziness >5/10.            Vestibular Treatment/Exercise - 05/28/20 0001      Vestibular Treatment/Exercise   Vestibular Treatment Provided Habituation    Habituation Exercises Comment;Brandt Daroff   see therapeutic activites for details     Austin Miles   Number of Reps  4    Symptom Description  bilaterally                 PT Education - 05/28/20 1245    Education Details Initial HEP (Habituation exercises)    Person(s) Educated  Patient    Methods Explanation;Demonstration;Handout    Comprehension Verbalized understanding;Returned demonstration            PT Short Term Goals - 05/21/20 1715      PT SHORT TERM GOAL #1   Title PT will perform 6 MWT to create goal for endurance in the next 1-2 visits    Time 2    Period Weeks    Status New    Target Date 06/04/20             PT Long Term Goals - 05/21/20 1713      PT LONG TERM GOAL #1   Title Pt will be independent with performing HEP for endurance and balance    Time 6    Period Weeks    Status New    Target Date 07/02/20      PT LONG TERM GOAL #2   Title Pt will have improved FGA to >/= 28/30    Baseline 24/30 on eval (05/21/20)    Time 6    Period Weeks    Status New    Target Date 07/02/20      PT LONG TERM GOAL #3   Title Pt will report symptoms </=1/5 when checking motion sensitivities    Baseline at most 3/5 in certain positions    Time 6    Period Weeks    Status New    Target Date 07/02/20      PT LONG TERM GOAL #4   Title Pt will have improved DHI to </=60/100 to decrease to moderate severity    Baseline 86/100 currently rated as severe    Time 6    Period Weeks    Status New    Target Date 07/02/20      PT LONG TERM GOAL #5   Title Pt will have improved FOTO score to >/= 68%    Baseline 45% score at eval (05/21/20)    Time 6    Period Weeks    Status New    Target Date 07/02/20                 Plan - 05/28/20 1250    Clinical Impression Statement Today's skilled PT session included assessment of orthostatics, no positive findings noted. BP reading elevated during completion, and required rest break to lower. Rest of session spent establishing initial HEP consistent of habituation exercises. Mild increases in dizziness, but able to return to baseline with seated rest break. Will continue per POC.    Personal  Factors and Comorbidities Age    Examination-Activity Limitations Locomotion  Level;Squat;Stairs;Lift;Bend    Examination-Participation Restrictions Interpersonal Relationship;Occupation;Cleaning;Yard Work;Community Activity    Stability/Clinical Decision Making Evolving/Moderate complexity    Rehab Potential Good    PT Frequency 1x / week    PT Duration 6 weeks    PT Treatment/Interventions ADLs/Self Care Home Management;Electrical Stimulation;Moist Heat;Iontophoresis 4mg /ml Dexamethasone;Cryotherapy;DME Instruction;Gait training;Stair training;Functional mobility training;Therapeutic activities;Therapeutic exercise;Balance training;Neuromuscular re-education;Patient/family education;Manual techniques;Energy conservation;Taping;Vestibular    PT Next Visit Plan How was exercises? progress vestibular/balance HEP as appropraite. Assess response to exercises to begin work on his motion sensitivities. Perform 6 MWT to assess pt's endurance.    PT Home Exercise Plan C[place]orne Cooksey exercises in supine and sitting    Consulted and Agree with Plan of Care Patient           Patient will benefit from skilled therapeutic intervention in order to improve the following deficits and impairments:  Decreased balance, Decreased endurance, Decreased mobility, Difficulty walking, Dizziness, Decreased activity tolerance  Visit Diagnosis: Dizziness and giddiness  Unsteadiness on feet  Other abnormalities of gait and mobility  Difficulty in walking, not elsewhere classified     Problem List Patient Active Problem List   Diagnosis Date Noted  . Obstructive sleep apnea   . Generalized anxiety disorder   . Major depressive disorder   . Weakness 12/22/2019  . Muscle fasciculation 12/22/2019  . Fatigue 10/30/2019  . History of COVID-19 08/2019  . Hypothyroidism 04/16/2019  . Internal hemorrhoids 08/21/2018  . Acute pericarditis   . Globus sensation   . Essential hypertension   . Empty sella syndrome   . Chest pain with low risk for cardiac etiology  12/22/2015    12/24/2015, PT, DPT 05/28/2020, 12:54 PM  [facility] [address] [place], [place], [place] Phone: [phone]   Fax:  [phone]  Name: Michael Warren MRN: Alto [place] Date of Birth: 1970/11/09

## 2020-05-28 NOTE — Patient Instructions (Addendum)
Access Code: [email or web address] URL: [email or web address] Date: 05/28/2020 Prepared by: Jethro Bastos  Exercises Brandt-Daroff Vestibular Exercise - 1 x daily - 5 x weekly - 1 sets - 5 reps Seated Nose to Left Knee Vestibular Habituation - 1 x daily - 5 x weekly - 2 sets - 5 reps Seated Nose to Right Knee Vestibular Habituation - 1 x daily - 5 x weekly - 2 sets - 5 reps

## 2020-06-01 ENCOUNTER — Ambulatory Visit: Payer: 59 | Admitting: Physical Therapy

## 2020-06-01 ENCOUNTER — Encounter: Payer: Self-pay | Admitting: Physical Therapy

## 2020-06-01 ENCOUNTER — Other Ambulatory Visit: Payer: Self-pay

## 2020-06-01 DIAGNOSIS — R42 Dizziness and giddiness: Secondary | ICD-10-CM

## 2020-06-01 DIAGNOSIS — R262 Difficulty in walking, not elsewhere classified: Secondary | ICD-10-CM

## 2020-06-01 DIAGNOSIS — R2681 Unsteadiness on feet: Secondary | ICD-10-CM

## 2020-06-01 DIAGNOSIS — R2689 Other abnormalities of gait and mobility: Secondary | ICD-10-CM

## 2020-06-01 NOTE — Patient Instructions (Addendum)
Access Code: [email or web address] URL: [email or web address] Date: 06/01/2020 Prepared by: Bufford Lope  Exercises Brandt-Daroff Vestibular Exercise - 1 x daily - 5 x weekly - 1 sets - 2-3 reps Seated Nose to Left Knee Vestibular Habituation - 1 x daily - 5 x weekly - 1 sets - 5 reps Seated Nose to Right Knee Vestibular Habituation - 1 x daily - 5 x weekly - 1 sets - 5 reps Seated Gaze Stabilization with Head Rotation - 1 x daily - 7 x weekly - 2 sets - 60 seconds hold Seated Gaze Stabilization with Head Nod - 1 x daily - 7 x weekly - 2 sets - 60 seconds hold

## 2020-06-01 NOTE — Therapy (Signed)
[facility] [address] [place], [place], [address] Phone: [phone]   Fax:  [phone]  Physical Therapy Treatment  Patient Details  Name: Michael Warren MRN: [id number] Date of Birth: 05/20/1971 Referring Provider (PT): Hope Pigeon, MD   Encounter Date: 06/01/2020   PT End of Session - 06/01/20 1153    Visit Number 3    Number of Visits 7    Date for PT Re-Evaluation 07/02/20    Authorization Type UHC    PT Start Time 1020    PT Stop Time 1100    PT Time Calculation (min) 40 min    Activity Tolerance Patient tolerated treatment well    Behavior During Therapy [facility] for tasks assessed/performed           Past Medical History:  Diagnosis Date  . Acute pericarditis   . Brain fog    stemming from COVID-19  . Empty sella syndrome    partial; on hormone replacement  . Esophageal ulcer   . Essential hypertension   . Fatigue 10/30/2019  . Generalized anxiety disorder   . History of COVID-19 08/2019  . Hypothyroidism 04/16/2019  . Internal hemorrhoids 08/21/2018  . Major depressive disorder   . Muscle fasciculation 12/22/2019  . Muscle weakness   . Obstructive sleep apnea    confirmed via sleep test; no current CPAP use  . Ringing in ear     Past Surgical History:  Procedure Laterality Date  . COLONOSCOPY    . URETEROSCOPY      There were no vitals filed for this visit.   Subjective Assessment - 06/01/20 1023    Subjective Hasn't checked BP since last session, isn't sure why it was up last session.  Still has a steady state of dizziness 4/5; when he performs the exercises dizziness goes up to 7/8 but it comes back down within minutes. Does have a little bit of nausea with exercises.  Can happen at work just sitting at his desk.    Patient Stated Goals Improve unsteadiness    Currently in Pain? No/denies              [facility] PT Assessment - 06/01/20 1028      Ambulation/Gait   Ambulation/Gait  Yes    Ambulation/Gait Assistance 5: Supervision    Ambulation/Gait Assistance Details ambulating very slowly and staying close to wall on L    Ambulation Distance (Feet) 830 Feet    Assistive device None    Ambulation Surface Level;Indoor      6 Minute Walk- Baseline   6 Minute Walk- Baseline yes    BP (mmHg) 135/80    HR (bpm) 47    02 Sat (%RA) 95 %    Modified Borg Scale for Dyspnea 0- Nothing at all    Perceived Rate of Exertion (Borg) 7- Very, very light      6 Minute walk- Post Test   6 Minute Walk Post Test yes    BP (mmHg) 130/80    HR (bpm) 68    02 Sat (%RA) 95 %    Modified Borg Scale for Dyspnea 2- Mild shortness of breath    Perceived Rate of Exertion (Borg) 13- Somewhat hard      6 minute walk test results    Aerobic Endurance Distance Walked 813    Endurance additional comments Dizziness increased to 7/10; during walk dizziness would spike to 8.  Pt reported feeling like he was rocking on a boat.  Has to really focus when walking                          Vestibular Treatment/Exercise - 06/01/20 1151      Vestibular Treatment/Exercise   Vestibular Treatment Provided Gaze;Habituation    Habituation Exercises Francee Piccolo Daroff;Seated Vertical Head Turns    Gaze Exercises X1 Viewing Horizontal;X1 Viewing Vertical      Austin Miles   Number of Reps  3    Symptom Description  to L and R; verbal cues to pause at each position and to stop when symptoms reach a 7/10      Seated Vertical Head Turns   Number of Reps  5    Symptom Description  bending over R and L knee; no change in symptoms      X1 Viewing Horizontal   Foot Position seated    Reps 1    Comments 30 seconds, no change in symptoms      X1 Viewing Vertical   Foot Position seated    Reps 1    Comments 30 seconds, no change in symptoms                 PT Education - 06/01/20 1152    Education Details results of 6 minute walk test; updated HEP, importance of monitoring  symptoms and only increasing symptoms by 1-2 points    Person(s) Educated Patient    Methods Explanation;Demonstration;Handout    Comprehension Verbalized understanding;Returned demonstration            PT Short Term Goals - 06/01/20 1206      PT SHORT TERM GOAL #1   Title PT will perform 6 MWT to create goal for endurance in the next 1-2 visits    Time 2    Period Weeks    Status Achieved    Target Date 06/04/20             PT Long Term Goals - 06/01/20 1206      PT LONG TERM GOAL #1   Title Pt will be independent with performing HEP for endurance and balance  (ALL LTG due by 07/02/20)    Time 6    Period Weeks    Status New      PT LONG TERM GOAL #2   Title Pt will have improved FGA to >/= 28/30    Baseline 24/30 on eval (05/21/20)    Time 6    Period Weeks    Status New      PT LONG TERM GOAL #3   Title Pt will report symptoms </=1/5 when checking motion sensitivities    Baseline at most 3/5 in certain positions    Time 6    Period Weeks    Status New      PT LONG TERM GOAL #4   Title Pt will have improved DHI to </=60/100 to decrease to moderate severity    Baseline 86/100 currently rated as severe    Time 6    Period Weeks    Status New      PT LONG TERM GOAL #5   Title Pt will have improved FOTO score to >/= 68%    Baseline 45% score at eval (05/21/20)    Time 6    Period Weeks    Status New      Additional Long Term Goals   Additional Long Term Goals Yes      PT LONG  TERM GOAL #6   Title Pt will increase distance on 6 minute walk by 200' and will report </= 4/10 dizziness after performing    Baseline 831', 7-8/10 dizziness    Time 6    Period Weeks    Status New    Target Date 07/02/20                 Plan - 06/01/20 1158    Clinical Impression Statement Pt demonstrating more stable BP today.  Continued to assess patient's symptoms and response to aerobic activity with 6 minute walk test.  Pt continues to to have to ambulate very  slowly due to significant increase in symptoms.  Reviewed HEP due to pt reporting significant increase in symptoms at home.  Modified brandt-daroff and added x1 viewing for gaze stability.  Will continue to monitor to ensure pt is only experiencing mild increase in symptoms.    Personal Factors and Comorbidities Age    Examination-Activity Limitations Locomotion Level;Squat;Stairs;Lift;Bend    Examination-Participation Restrictions Interpersonal Relationship;Occupation;Cleaning;Yard Work;Community Activity    Stability/Clinical Decision Making Evolving/Moderate complexity    Rehab Potential Good    PT Frequency 1x / week    PT Duration 6 weeks    PT Treatment/Interventions ADLs/Self Care Home Management;Electrical Stimulation;Moist Heat;Iontophoresis 4mg /ml Dexamethasone;Cryotherapy;DME Instruction;Gait training;Stair training;Functional mobility training;Therapeutic activities;Therapeutic exercise;Balance training;Neuromuscular re-education;Patient/family education;Manual techniques;Energy conservation;Taping;Vestibular    PT Next Visit Plan No true vertigo; feels like he is rocking on a boat -constatnt.  Continue to progress x1 viewing, habituation to walking faster speeds.    Consulted and Agree with Plan of Care Patient           Patient will benefit from skilled therapeutic intervention in order to improve the following deficits and impairments:  Decreased balance, Decreased endurance, Decreased mobility, Difficulty walking, Dizziness, Decreased activity tolerance  Visit Diagnosis: Dizziness and giddiness  Unsteadiness on feet  Other abnormalities of gait and mobility  Difficulty in walking, not elsewhere classified     Problem List Patient Active Problem List   Diagnosis Date Noted  . Obstructive sleep apnea   . Generalized anxiety disorder   . Major depressive disorder   . Weakness 12/22/2019  . Muscle fasciculation 12/22/2019  . Fatigue 10/30/2019  . History of COVID-19  08/2019  . Hypothyroidism 04/16/2019  . Internal hemorrhoids 08/21/2018  . Acute pericarditis   . Globus sensation   . Essential hypertension   . Empty sella syndrome   . Chest pain with low risk for cardiac etiology 12/22/2015    12/24/2015, PT, DPT 06/01/20    12:11 PM    [facility] [address] [place], [place], [place] Phone: [phone]   Fax:  [phone]  Name: Roniel Halloran MRN: Alto [place] Date of Birth: Apr 01, 1971

## 2020-06-08 ENCOUNTER — Encounter: Payer: Self-pay | Admitting: Occupational Therapy

## 2020-06-08 ENCOUNTER — Ambulatory Visit: Payer: 59

## 2020-06-08 ENCOUNTER — Ambulatory Visit: Payer: 59 | Admitting: Physical Therapy

## 2020-06-08 ENCOUNTER — Ambulatory Visit: Payer: 59 | Admitting: Occupational Therapy

## 2020-06-08 ENCOUNTER — Other Ambulatory Visit: Payer: Self-pay

## 2020-06-08 VITALS — BP 137/87 | HR 67

## 2020-06-08 DIAGNOSIS — R42 Dizziness and giddiness: Secondary | ICD-10-CM

## 2020-06-08 DIAGNOSIS — R41842 Visuospatial deficit: Secondary | ICD-10-CM

## 2020-06-08 DIAGNOSIS — R2681 Unsteadiness on feet: Secondary | ICD-10-CM

## 2020-06-08 DIAGNOSIS — R2689 Other abnormalities of gait and mobility: Secondary | ICD-10-CM

## 2020-06-08 DIAGNOSIS — R41844 Frontal lobe and executive function deficit: Secondary | ICD-10-CM

## 2020-06-08 DIAGNOSIS — R41841 Cognitive communication deficit: Secondary | ICD-10-CM

## 2020-06-08 DIAGNOSIS — R41840 Attention and concentration deficit: Secondary | ICD-10-CM

## 2020-06-08 NOTE — Therapy (Addendum)
[facility] [address] [place], [place], [address] Phone: [phone]   Fax:  [phone]  Occupational Therapy Evaluation  Patient Details  Name: Michael Warren MRN: [id number] Date of Birth: 1975-04-07 Referring Provider (OT): Dr. Hope Pigeon   Encounter Date: 06/08/2020   OT End of Session - 06/08/20 1227    Visit Number 1    Number of Visits 17    Date for OT Re-Evaluation 09/06/20    Authorization Type [employer] 2021:  60 visit limit combined OT, PT, ST    OT Start Time [phone]    OT Stop Time 0933    OT Time Calculation (min) 41 min    Activity Tolerance Patient tolerated treatment well    Behavior During Therapy [facility] for tasks assessed/performed           Past Medical History:  Diagnosis Date  . Acute pericarditis   . Brain fog    stemming from COVID-19  . Empty sella syndrome    partial; on hormone replacement  . Esophageal ulcer   . Essential hypertension   . Fatigue 10/30/2019  . Generalized anxiety disorder   . History of COVID-19 08/2019  . Hypothyroidism 04/16/2019  . Internal hemorrhoids 08/21/2018  . Major depressive disorder   . Muscle fasciculation 12/22/2019  . Muscle weakness   . Obstructive sleep apnea    confirmed via sleep test; no current CPAP use  . Ringing in ear     Past Surgical History:  Procedure Laterality Date  . COLONOSCOPY    . URETEROSCOPY      There were no vitals filed for this visit.   Subjective Assessment - 06/08/20 0858    Subjective  Pt reports difficulty with visual fatigue, generalized fatigue, difficulty with attention, and constant dizziness    Pertinent History Visual impairments after Covid-19 (08/2019) with dizziness, fatigue, brain fog.    PMH:  sleep apnea, Empty sella syndrome (CMS-HCC), Anxiety, Depression, Hypertension, Hypothyroid, Pericarditis, PVC (premature ventricular contraction)  and Tinnitus    Patient Stated Goals to get better    Currently in  Pain? Yes    Pain Score 3     Pain Location Generalized    Pain Descriptors / Indicators Aching;Dull    Pain Type Chronic pain    Pain Onset More than a month ago    Pain Frequency Constant    Aggravating Factors  over-exertion    Pain Relieving Factors unknown             [facility] OT Assessment - 06/08/20 0903      Assessment   Medical Diagnosis Visual loss/functional visual training after Covid-19 infection    Referring Provider (OT) Dr. 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        IADL   Prior Level of Function Shopping independent prior, difficult now due to fatigue and dizziness    Prior Level of Function Light Housekeeping indpendent prior.  Currently doing some light tasks, and doing some yardwork (using leaf blower with frequent rest).   also has cleaning person every 2 weeks   Prior Level of Function Meal Prep independent prior.  Currently cooking, but difficulty with attention and memory (if he walks away).  Difficulty following directions (pt avoids)    Community Mobility --   pt is driving, but reports dizziness   Medication Management Has difficulty remembering to take medication   has pill organizer, but forgets to use/take medication   Prior Level of Function Financial Management wife has  always performed (however, pt reports that he could keep up with it now)      Vision - History   Baseline Vision Wears glasses all the time    Additional Comments Progressive lens, has had them updated since Covid      Vision Assessment   Ocular Range of Motion Within Functional Limits    Tracking/Visual Pursuits --   WFL   Saccades Decreased speed of saccadic movement    Comment 45M visual scanning/number cancellation with 1 error, but noted significant reduction with speed at approx 50% down page with reports of nausea and describes difficulty continuing physical arm movement.  Pt reports driving and walking down minimally visually demanding hallway at work incr feeling of dizziness.  Pt reports visual fatigue with reading/computer work or maintaining eye contact, reports watching video on phone makes gives him nausea      Activity Tolerance   Activity Tolerance Comments Pt reports that he is only able to do simple yardwork for at a time, has difficulty getting up in the morning, decr energy and slower with all activity   looking up incr dizziness     Cognition   Overall Cognitive Status Impaired/Different from baseline    Area of Impairment Attention;Memory;Following commands    Current Attention Level Selective    Attention Comments Pt reports difficulty/incr time to focus on work tasks, reading.  Pt has to stay at stove if cooking or he gets distracted and forgets items on stove, difficulty following written directions.    Memory Decreased short-term memory    Memory Comments Pt reports that he leaves faucet on, misplaces items, checks doors multiple times due to forgetting if he already checked it, forgets meds/can't remember if he's taken meds, forgets tasks before starting     Following Commands --   difficuty with unfamiliar written directions   Memory --                             OT Short Term Goals - 06/08/20 1840      OT SHORT TERM GOAL #1   Title  Pt will verbalize understanding of visual compensation strategies for ADLs and IADLs.--check STGs12/30/21    Time 4    Period Weeks    Status New      OT SHORT TERM GOAL #2   Title Pt will verbalize understanding of memory and cognitive compensation stategies for ADLs/IADLs including work tasks.    Time 4    Period Weeks    Status  New      OT SHORT TERM GOAL #3   Title Pt will be able to perform seated functional visual scanning tasks such as reading, working on computer, scanning for at least 10min without rest break or significantly decreasing speed and with dizziness/nausea rated at 3/10 or less.    Time 4    Period Weeks    Status New      OT SHORT TERM GOAL #4   Title Pt will be able to perform dynamic visual scanning/environmental scanning in busy environment with at least 95% accuracy and dizziness/nausea rated at 3/10 or less for incr ease with work tasks and community activities/shopping.    Time 4    Period Weeks    Status New      OT SHORT TERM GOAL #5   Title Pt will perform alternating attention for IADLs(cooking) and simulated work tasks with at least 75% accuracy.    Time 4    Period Weeks    Status New             OT Long Term Goals - 06/08/20 1849      OT LONG TERM GOAL #1   Title Pt will be independent with home program for vision, functional cognition, and IADL participation.--check LTGs 09/08/20    Time 12    Period Weeks    Status New      OT LONG TERM GOAL #2   Title Pt will be able to perform environmental scanning with simple divided attention with at least 90% accuracy.    Time 12    Period Weeks    Status New      OT LONG TERM GOAL #3   Title Pt will perform alternating attention for IADLs(cooking) and simulated work tasks with at least 90% accuracy.    Time 12    Period Weeks    Status New      OT LONG TERM GOAL #4   Title Pt will perform visually demanding tasks (reading, scanning, environmental scanning, computer work) for at least 25  min without rest and dizziness/nausea less than or equal to 3/10.    Time 12    Period Weeks    Status New                 Plan - 06/08/20 1813    Clinical Impression Statement Pt is a 49 y.o. male referred to occupational therapy for functional visual impairments after Covid-19 infection 08/2019.  Pt with PMH that includes:  Covid-19 infection 08/2019 (now with Long Covid with dizziness, fatigue, brain fog), sleep apnea, anxiety, depression, HTN, hypothyroid, pericarditis, PVC, and tinnitus.  Pt reports difficulty with dizzness/nausea and visual deficits, general fatigue as well as eye fatigue, decr ability to focus/cognitive deficits affecting ADLs/IADLs.  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    Pt presents today with visual-perceptual deficits and cognitive deficits affecting ADLs/IADLs and would benefit from occupational therapy to address these deficits.    OT Occupational Profile and History Detailed Assessment- Review of Records and additional review of physical, cognitive, psychosocial history related to current functional performance    Occupational performance deficits (Please refer to evaluation for details): ADL's;IADL's;Leisure;Social Participation;Work    Games developerBody Structure / Function / Physical Skills ADL;Vision;IADL    Cognitive Skills Memory;Attention    Rehab Potential Good    Clinical Decision Making Several treatment options, min-mod task modification necessary  Comorbidities Affecting Occupational Performance: May have comorbidities impacting occupational performance    Modification or Assistance to Complete Evaluation  Min-Moderate modification of tasks or assist with assess necessary to complete eval    OT Frequency --    OT Duration --   2x/wk for 4 weeks followed by 1x/wk for 8 wks +eval 0r 17 total visits   OT Treatment/Interventions Self-care/ADL training;Aquatic Therapy;Coping strategies  training;Therapeutic activities;Cognitive remediation/compensation;Visual/perceptual remediation/compensation;Patient/family education;Therapeutic exercise    Plan work on attention and visual scanning and compensation strategies    Consulted and Agree with Plan of Care Patient           Patient will benefit from skilled therapeutic intervention in order to improve the following deficits and impairments:   Body Structure / Function / Physical Skills: ADL, Vision, IADL Cognitive Skills: Memory, Attention     Visit Diagnosis: Attention and concentration deficit  Frontal lobe and executive function deficit  Unsteadiness on feet  Visuospatial deficit    Problem List Patient Active Problem List   Diagnosis Date Noted  . Obstructive sleep apnea   . Generalized anxiety disorder   . Major depressive disorder   . Weakness 12/22/2019  . Muscle fasciculation 12/22/2019  . Fatigue 10/30/2019  . History of COVID-19 08/2019  . Hypothyroidism 04/16/2019  . Internal hemorrhoids 08/21/2018  . Acute pericarditis   . Globus sensation   . Essential hypertension   . Empty sella syndrome   . Chest pain with low risk for cardiac etiology 12/22/2015    [facility] 06/08/2020, 7:05 PM  [facility] [address] [place], [place], [address] Phone: [phone]   Fax:  [phone]  Name: Israel Werts MRN: [id number] Date of Birth: [birth year]   Willa Frater, OTR/L [facility] [address] [place], [place]  [address] [phone] phone [phone] 06/08/20 7:05 PM

## 2020-06-08 NOTE — Therapy (Signed)
[facility] [address] [place], [place], [address] Phone: [phone]   Fax:  [phone]  Speech Language Pathology Evaluation  Patient Details  Name: Michael Warren MRN: [id number] Date of Birth: 1970-10-05 Referring Provider (SLP): Hope Pigeon, MD   Encounter Date: 06/08/2020   End of Session - 06/08/20 2220    Visit Number 1    Number of Visits 16    Date for SLP Re-Evaluation 09/06/20    SLP Start Time 0809   7 minutes late   SLP Stop Time  0846    SLP Time Calculation (min) 37 min    Activity Tolerance Patient tolerated treatment well           Past Medical History:  Diagnosis Date  . Acute pericarditis   . Brain fog    stemming from COVID-19  . Empty sella syndrome    partial; on hormone replacement  . Esophageal ulcer   . Essential hypertension   . Fatigue 10/30/2019  . Generalized anxiety disorder   . History of COVID-19 08/2019  . Hypothyroidism 04/16/2019  . Internal hemorrhoids 08/21/2018  . Major depressive disorder   . Muscle fasciculation 12/22/2019  . Muscle weakness   . Obstructive sleep apnea    confirmed via sleep test; no current CPAP use  . Ringing in ear     Past Surgical History:  Procedure Laterality Date  . COLONOSCOPY    . URETEROSCOPY      There were no vitals filed for this visit.   Subjective Assessment - 06/08/20 2154    Subjective "I wonder how someone could walk around --being 'normal'." (pt, re: his neuropsych testing)    Currently in Pain? Yes    Pain Score 3     Pain Location --   general   Pain Descriptors / Indicators Aching;Dull    Pain Type Chronic pain              SLP Evaluation [facility] - 06/08/20 2154      SLP Visit Information   SLP Received On 06/08/20    Referring Provider (SLP) Hope Pigeon, MD    Onset Date February 2021    Medical Diagnosis Other symptoms of cognitive differences      Subjective   Patient/Family Stated Goal Improve  memory, decrease "fog", increase focus      General Information   HPI Pt with COVID-19 dx in February 2021 and since this, pt has noted decr'd attention, memory, and processing. Pt is on light duty at the [employer] until 07-09-20.    Mobility Status On PT caseload due to dizziness.      Balance Screen   Has the patient fallen in the past 6 months No      Prior Functional Status   Cognitive/Linguistic Baseline Within functional limits      Cognition   Overall Cognitive Status Impaired/Different from baseline    Area of Impairment Attention;Memory;Following commands;Problem solving    Current Attention Level Selective    Attention Comments Pt reports it is difficult to stay focused with desk work on light duty and because of this it takes him longer to complete tasks now compared to pre-COVID. Pt also told SLP "I look away when I talk to people now. It's easier to focus on nothing (as I talk with the person)." Michael Warren also tells SLP that his processing appears slower now than before he had COVID-19.  "I used to be quick, but sometimes  now it takes me a second for (auditory) things to make sense."    Memory --   decreased immediate and/or short term memory   Memory Comments Michael Warren tells SLP that he will forget why he has gone to a specific room, which occurred rarely prior to COVID-19 however "Now it happens all the time," he said.     Problem Solving Slow processing    Problem Solving Comments "Things that are multiple steps are much more challenging to complete" - this example, [name] told SLP he had to write out steps to complete investment changes requiring phone calls and filling out forms.  "I had to make a simple list to get organized."    Behaviors --   he got frustrated that he wrote a list for organization     Auditory Comprehension   Overall Auditory Comprehension --   vs processing/ auditory processing     Verbal Expression   Overall Verbal Expression  Appears within functional limits for tasks assessed      Oral Motor/Sensory Function   Overall Oral Motor/Sensory Function Appears within functional limits for tasks assessed      Motor Speech   Overall Motor Speech Appears within functional limits for tasks assessed      Standardized Assessments   Standardized Assessments  Other Assessment   Hopkins Verbal Learning Assessment   Other Assessment Recall: 6/12 (WNL), 11/12 (WNL), 11/12(WNL) RECOGNITION: 11 (below WNL) - however pt heard "house" on the last two recitations of the 12 word list so he indicated "house" was one of the 12 words on the list. This would indicate processing deficit in area of auditory information.                           SLP Education - 06/08/20 2216    Education Details eval results and possible goals, constant therapy app    Person(s) Educated Patient    Methods Explanation;Handout    Comprehension Verbalized understanding            SLP Short Term Goals - 06/08/20 2229      SLP SHORT TERM GOAL #1   Title pt will complete functional mod complex alternating attention tasks with modified independence in 3 sessions    Time 4    Period Weeks    Status New      SLP SHORT TERM GOAL #2   Title pt will demo functional linguistic processing (auditory and written intormation) in structured tasks with rare min-mod A in 3 sessions    Time 4    Period Weeks    Status New      SLP SHORT TERM GOAL #3   Title pt will demo appropriate skills with appointment management, completing to-do lists, and other more simple functional tasks involving problem solving and organization in 3 sessions    Time 4    Period Weeks    Status New            SLP Long Term Goals - 06/08/20 2230      SLP LONG TERM GOAL #1   Title pt will complete functional simple tasks using divided attention in 3 sessions    Time 8    Period Weeks   or 17 sessions,for all LTGs   Status New      SLP LONG TERM GOAL #2    Title pt will demo functional linguistic processing (auditory and written intormation) in work-like tasks in 3  sessions    Time 8    Period Weeks    Status New      SLP LONG TERM GOAL #3   Title pt will demo appropriate skills with financial management, scheduling, and other functional tasks involving problem solving and organization in 3 sessions    Time 8    Period Weeks    Status New            Plan - 06/08/20 2221    Clinical Impression Statement Michael Warren presents today with cognitive-linguistics following contracing COVID-19 in Februray 2021. See body of the evaluation for specific examples of how these deficits are affecting his daily living tasks at home and at work. Pt has experienced frustration regarding his deficits. Neuropsych testing recently found pt scores were all WNL except for one score (indicating an encoding deficit) outside/below WNL This finding coincides with pt's descriptions of his current defiicts. Based on his anecdotal examples he has provided today this SLP believes Michael Warren is having more difficulty with attention (and thus memory), organization/problem solving, and processing. Pt would beneift from skilled ST targeting attention, organization and problem solving in terms of linguistics. OT will address pt's memory strategies.    Speech Therapy Frequency 2x / week    Duration 8 weeks   or 17 sessions   Treatment/Interventions Environmental controls;Functional tasks;Compensatory techniques;Cueing hierarchy;SLP instruction and feedback;Internal/external aids;Patient/family education;Cognitive reorganization    Potential to Achieve Goals Good    Consulted and Agree with Plan of Care Patient           Patient will benefit from skilled therapeutic intervention in order to improve the following deficits and impairments:   Cognitive communication deficit    Problem List Patient Active Problem List   Diagnosis Date Noted  . Obstructive sleep apnea   .  Generalized anxiety disorder   . Major depressive disorder   . Weakness 12/22/2019  . Muscle fasciculation 12/22/2019  . Fatigue 10/30/2019  . History of COVID-19 08/2019  . Hypothyroidism 04/16/2019  . Internal hemorrhoids 08/21/2018  . Acute pericarditis   . Globus sensation   . Essential hypertension   . Empty sella syndrome   . Chest pain with low risk for cardiac etiology 12/22/2015    [facility] ,MS, CCC-SLP  06/08/2020, 10:40 PM  [facility] [address] [place], [place], [address] Phone: [phone]   Fax:  [phone]  Name: Janice Seales MRN: [id number] Date of Birth: 1970/11/25

## 2020-06-08 NOTE — Patient Instructions (Signed)
   Constant therapy - app  [email or web address]   Tips to help facilitate better attention, concentration, focus     Do harder, longer tasks when you are most alert/awake   Break down larger tasks into small parts   Limit distractions of TV, radio, conversation, e mails/texts, appliance noise, etc - if a job is important, do it in a quiet room   Be aware of how you are functioning in high stimulation environments such as large stores, parties, restaurants - any place with lots of lights, noise, signs etc   Group conversations may be more difficult to process than one on one conversations   Give yourself extra time to process conversation, reading materials, directions or information from your healthcare providers   Organization is key - clutters of laundry, mail, paperwork, dirty dishes - all make it more difficult to concentrate   Before you start a task, have all the needed supplies, directions, recipes ready and organized. This way you don't have to go looking for something in the middle of a task and become distracted.    Be aware of fatigue - take rests or breaks when needed to re-group and re-focus

## 2020-06-08 NOTE — Patient Instructions (Signed)
Practice walking with head turns - horizontal & vertical in the home  Practice walking fast - NO head turns  DO letter exercise in STANDING - at least 30 sec horizontal;  45 secs vertical --- 3-5x/day

## 2020-06-08 NOTE — Therapy (Signed)
[facility] [address] [place], [place], [address] Phone: [phone]   Fax:  [phone]  Physical Therapy Treatment  Patient Details  Name: Michael Warren MRN: [id number] Date of Birth: [birth year] Referring Provider (PT): Hope Pigeon, MD   Encounter Date: 06/08/2020   PT End of Session - 06/08/20 1955    Visit Number 4    Number of Visits 7    Date for PT Re-Evaluation 07/02/20    Authorization Type UHC    PT Start Time [phone]    PT Stop Time 1015    PT Time Calculation (min) 39 min    Activity Tolerance Patient tolerated treatment well    Behavior During Therapy [facility] for tasks assessed/performed           Past Medical History:  Diagnosis Date  . Acute pericarditis   . Brain fog    stemming from COVID-19  . Empty sella syndrome    partial; on hormone replacement  . Esophageal ulcer   . Essential hypertension   . Fatigue 10/30/2019  . Generalized anxiety disorder   . History of COVID-19 08/2019  . Hypothyroidism 04/16/2019  . Internal hemorrhoids 08/21/2018  . Major depressive disorder   . Muscle fasciculation 12/22/2019  . Muscle weakness   . Obstructive sleep apnea    confirmed via sleep test; no current CPAP use  . Ringing in ear     Past Surgical History:  Procedure Laterality Date  . COLONOSCOPY    . URETEROSCOPY      Vitals:   06/08/20 0943  BP: 137/87  Pulse: 67     Subjective Assessment - 06/08/20 0940    Subjective Pt states the exercise he just did in OT (had OT eval prior to PT appt) made him very dizzy - rated intensity 8/10 (was activity involving visual scanning with green highlighter used)    Patient Stated Goals Improve unsteadiness    Currently in Pain? Yes    Pain Score 3     Pain Location --   Generalized   Pain Descriptors / Indicators Aching;Dull    Pain Type Chronic pain    Pain Onset More than a month ago    Pain Frequency Constant                              [facility] Adult PT Treatment/Exercise - 06/08/20 0958      Ambulation/Gait   Ambulation/Gait Yes    Ambulation/Gait Assistance 5: Supervision    Ambulation/Gait Assistance Details amb. with abrupt stop and turn and with turn/stop;  also increased speed without head turns    Ambulation Distance (Feet) 200 Feet    Assistive device None    Gait Pattern Within Functional Limits    Ambulation Surface Level;Indoor           Vestibular Treatment/Exercise - 06/08/20 0001      Vestibular Treatment/Exercise   Gaze Exercises X1 Viewing Horizontal;X1 Viewing Vertical      X1 Viewing Horizontal   Foot Position standing - bil. stance    Reps 1    Comments 60 secs   increased symptoms after 15 secs but able to continue     X1 Viewing Vertical   Foot Position standing - bil. stance    Time --   60 secs   Reps 1    Comments 60 secs   increased symptoms after 35 secs  Balance Exercises - 06/08/20 0001      Balance Exercises: Standing   Standing Eyes Opened Wide (BOA);Head turns;Foam/compliant surface;5 reps   horizontal & vertical   Gait with Head Turns Forward;4 reps   40' - horizontal and vertical   Turning Both   4 reps total - 2 reps to each side   Marching Foam/compliant surface;Static;10 reps   on 1 pillow in corner            PT Education - 06/08/20 1954    Education Details progressed HEP - gaze stabilization to standing, amb. with head turns and fast amb.    Person(s) Educated Patient    Methods Explanation    Comprehension Verbalized understanding            PT Short Term Goals - 06/08/20 2001      PT SHORT TERM GOAL #1   Title PT will perform 6 MWT to create goal for endurance in the next 1-2 visits    Time 2    Period Weeks    Status Achieved    Target Date 06/04/20             PT Long Term Goals - 06/08/20 2001      PT LONG TERM GOAL #1   Title Pt will be independent with performing HEP for endurance  and balance  (ALL LTG due by 07/02/20)    Time 6    Period Weeks    Status New      PT LONG TERM GOAL #2   Title Pt will have improved FGA to >/= 28/30    Baseline 24/30 on eval (05/21/20)    Time 6    Period Weeks    Status New      PT LONG TERM GOAL #3   Title Pt will report symptoms </=1/5 when checking motion sensitivities    Baseline at most 3/5 in certain positions    Time 6    Period Weeks    Status New      PT LONG TERM GOAL #4   Title Pt will have improved DHI to </=60/100 to decrease to moderate severity    Baseline 86/100 currently rated as severe    Time 6    Period Weeks    Status New      PT LONG TERM GOAL #5   Title Pt will have improved FOTO score to >/= 68%    Baseline 45% score at eval (05/21/20)    Time 6    Period Weeks    Status New      PT LONG TERM GOAL #6   Title Pt will increase distance on 6 minute walk by 200' and will report </= 4/10 dizziness after performing    Baseline 831', 7-8/10 dizziness    Time 6    Period Weeks    Status New                 Plan - 06/08/20 1957    Clinical Impression Statement Pt demonstrates vestibular hypofunction with c/o incr. dizziness with standing on compliant surfaces with head and eye movement.  Pt also noted to move en bloc with turning for compensatory strategy with decreased gait speed.  Pt also noted to close eyes during turning for compensation to decrease visual input during the turn.  Pt able to tolerate x1 viewing horizontally for approx. 15 secs vertically for approx. 30 secs prior to c/o increased dizziness. Cont with POC.  Personal Factors and Comorbidities Age    Examination-Activity Limitations Locomotion Level;Squat;Stairs;Lift;Bend    Examination-Participation Restrictions Interpersonal Relationship;Occupation;Cleaning;Yard Work;Community Activity    Stability/Clinical Decision Making Evolving/Moderate complexity    Rehab Potential Good    PT Frequency 1x / week    PT Duration 6  weeks    PT Treatment/Interventions ADLs/Self Care Home Management;Electrical Stimulation;Moist Heat;Iontophoresis 4mg /ml Dexamethasone;Cryotherapy;DME Instruction;Gait training;Stair training;Functional mobility training;Therapeutic activities;Therapeutic exercise;Balance training;Neuromuscular re-education;Patient/family education;Manual techniques;Energy conservation;Taping;Vestibular    PT Next Visit Plan No true vertigo; feels like he is rocking on a boat -constatnt.  Continue to progress x1 viewing, habituation to walking faster speeds.    Consulted and Agree with Plan of Care Patient           Patient will benefit from skilled therapeutic intervention in order to improve the following deficits and impairments:  Decreased balance, Decreased endurance, Decreased mobility, Difficulty walking, Dizziness, Decreased activity tolerance  Visit Diagnosis: Dizziness and giddiness  Other abnormalities of gait and mobility  Unsteadiness on feet     Problem List Patient Active Problem List   Diagnosis Date Noted  . Obstructive sleep apnea   . Generalized anxiety disorder   . Major depressive disorder   . Weakness 12/22/2019  . Muscle fasciculation 12/22/2019  . Fatigue 10/30/2019  . History of COVID-19 08/2019  . Hypothyroidism 04/16/2019  . Internal hemorrhoids 08/21/2018  . Acute pericarditis   . Globus sensation   . Essential hypertension   . Empty sella syndrome   . Chest pain with low risk for cardiac etiology 12/22/2015    12/24/2015, PT 06/08/2020, 8:03 PM  [facility] [address] [place], [place], [place] Phone: [phone]   Fax:  [phone]  Name: Emmit Oriley MRN: [id number] Date of Birth: 11-26-1970

## 2020-06-08 NOTE — Addendum Note (Signed)
Addended by: Willa Frater D on: 06/08/2020 07:06 PM   Modules accepted: Orders

## 2020-06-15 ENCOUNTER — Ambulatory Visit: Payer: 59

## 2020-06-15 ENCOUNTER — Ambulatory Visit: Payer: 59 | Attending: *Deleted | Admitting: Occupational Therapy

## 2020-06-15 ENCOUNTER — Other Ambulatory Visit: Payer: Self-pay

## 2020-06-15 ENCOUNTER — Ambulatory Visit: Payer: 59 | Admitting: Physical Therapy

## 2020-06-15 DIAGNOSIS — R262 Difficulty in walking, not elsewhere classified: Secondary | ICD-10-CM | POA: Diagnosis present

## 2020-06-15 DIAGNOSIS — R41842 Visuospatial deficit: Secondary | ICD-10-CM | POA: Insufficient documentation

## 2020-06-15 DIAGNOSIS — R42 Dizziness and giddiness: Secondary | ICD-10-CM

## 2020-06-15 DIAGNOSIS — R2681 Unsteadiness on feet: Secondary | ICD-10-CM | POA: Insufficient documentation

## 2020-06-15 DIAGNOSIS — R2689 Other abnormalities of gait and mobility: Secondary | ICD-10-CM | POA: Diagnosis present

## 2020-06-15 DIAGNOSIS — R41840 Attention and concentration deficit: Secondary | ICD-10-CM | POA: Diagnosis present

## 2020-06-15 DIAGNOSIS — R41841 Cognitive communication deficit: Secondary | ICD-10-CM | POA: Insufficient documentation

## 2020-06-15 NOTE — Therapy (Signed)
[facility] [address] [place], [place], [address] Phone: [phone]   Fax:  [phone]  Physical Therapy Treatment  Patient Details  Name: Michael Warren MRN: [id number] Date of Birth: 08/17/1970 Referring Provider (PT): Hope Pigeon, MD   Encounter Date: 06/15/2020   PT End of Session - 06/15/20 1022    Visit Number 5    Number of Visits 7    Date for PT Re-Evaluation 07/02/20    Authorization Type UHC    PT Start Time 1020   Patient finishing up with OT   PT Stop Time 1100    PT Time Calculation (min) 40 min    Activity Tolerance Patient tolerated treatment well    Behavior During Therapy [facility] for tasks assessed/performed           Past Medical History:  Diagnosis Date  . Acute pericarditis   . Brain fog    stemming from COVID-19  . Empty sella syndrome    partial; on hormone replacement  . Esophageal ulcer   . Essential hypertension   . Fatigue 10/30/2019  . Generalized anxiety disorder   . History of COVID-19 08/2019  . Hypothyroidism 04/16/2019  . Internal hemorrhoids 08/21/2018  . Major depressive disorder   . Muscle fasciculation 12/22/2019  . Muscle weakness   . Obstructive sleep apnea    confirmed via sleep test; no current CPAP use  . Ringing in ear     Past Surgical History:  Procedure Laterality Date  . COLONOSCOPY    . URETEROSCOPY      There were no vitals filed for this visit.   Subjective Assessment - 06/15/20 1022    Subjective Patient reports that his red blood cell count was high, MD will continue to investigate. Patietn reports dizziness 6/10 currently. Reports that new additions to HEP are going good, patient reports walking fast is extremely difficult.    Patient Stated Goals Improve unsteadiness    Currently in Pain? Yes    Pain Score 2     Pain Location Head    Pain Orientation --   generalized; all over head   Pain Descriptors / Indicators Headache    Pain Type  Acute pain    Pain Onset More than a month ago           Neuro re-ed: sensory organization test performed with following results:  Conditions: 1: 1st trial below age related, 2nd and 3rd trial above age related norms 2: above age related norms 3: above age related norms  4: above age related norms 5: above age related norms 6: above age related norms Composite score: above age related norms (84%) Sensory Analysis Som: above age related norms Vis: above age related norms Vest: above age related norms Pref: above age related norms Strategy analysis: Ankle Dominant       COG alignment: WNL       Increased symptoms/dizziness with completion of SOT, patient reporting 7/10 after completion. Patient require seated rest break after completion.     Vestibular Treatment/Exercise - 06/15/20 0001      Vestibular Treatment/Exercise   Gaze Exercises X1 Viewing Horizontal;X1 Viewing Vertical      X1 Viewing Horizontal   Foot Position standing feet apart    Reps 2    Comments 30 secs 1st rep; 2nd rep 15 seconds needing to stop due to increased symptoms.       X1 Viewing Vertical   Foot Position standing feet apart  Reps 2    Comments 30 seconds 1st rep, 45 secs 2nd rep; mild increase in symptoms                   PT Short Term Goals - 06/08/20 2001      PT SHORT TERM GOAL #1   Title PT will perform 6 MWT to create goal for endurance in the next 1-2 visits    Time 2    Period Weeks    Status Achieved    Target Date 06/04/20             PT Long Term Goals - 06/08/20 2001      PT LONG TERM GOAL #1   Title Pt will be independent with performing HEP for endurance and balance  (ALL LTG due by 07/02/20)    Time 6    Period Weeks    Status New      PT LONG TERM GOAL #2   Title Pt will have improved FGA to >/= 28/30    Baseline 24/30 on eval (05/21/20)    Time 6    Period Weeks    Status New      PT LONG TERM GOAL #3   Title Pt will report symptoms </=1/5  when checking motion sensitivities    Baseline at most 3/5 in certain positions    Time 6    Period Weeks    Status New      PT LONG TERM GOAL #4   Title Pt will have improved DHI to </=60/100 to decrease to moderate severity    Baseline 86/100 currently rated as severe    Time 6    Period Weeks    Status New      PT LONG TERM GOAL #5   Title Pt will have improved FOTO score to >/= 68%    Baseline 45% score at eval (05/21/20)    Time 6    Period Weeks    Status New      PT LONG TERM GOAL #6   Title Pt will increase distance on 6 minute walk by 200' and will report </= 4/10 dizziness after performing    Baseline 831', 7-8/10 dizziness    Time 6    Period Weeks    Status New                 Plan - 06/15/20 1103    Clinical Impression Statement Today's skilled PT session included completion of SOT. All systems above age related norms. Patient did have increased dizziness and motion sensitivity symptoms with completion of test.  Continued progression of VOR x 1 in standing as tolerated by patient. Will continue to progress toward all goals.    Personal Factors and Comorbidities Age    Examination-Activity Limitations Locomotion Level;Squat;Stairs;Lift;Bend    Examination-Participation Restrictions Interpersonal Relationship;Occupation;Cleaning;Yard Work;Community Activity    Stability/Clinical Decision Making Evolving/Moderate complexity    Rehab Potential Good    PT Frequency 1x / week    PT Duration 6 weeks    PT Treatment/Interventions ADLs/Self Care Home Management;Electrical Stimulation;Moist Heat;Iontophoresis 4mg /ml Dexamethasone;Cryotherapy;DME Instruction;Gait training;Stair training;Functional mobility training;Therapeutic activities;Therapeutic exercise;Balance training;Neuromuscular re-education;Patient/family education;Manual techniques;Energy conservation;Taping;Vestibular    PT Next Visit Plan No true vertigo; feels like he is rocking on a boat -constatnt.   Continue to progress x1 viewing, habituation to walking faster speeds.    Consulted and Agree with Plan of Care Patient           Patient  will benefit from skilled therapeutic intervention in order to improve the following deficits and impairments:  Decreased balance, Decreased endurance, Decreased mobility, Difficulty walking, Dizziness, Decreased activity tolerance  Visit Diagnosis: Unsteadiness on feet  Dizziness and giddiness  Other abnormalities of gait and mobility  Difficulty in walking, not elsewhere classified     Problem List Patient Active Problem List   Diagnosis Date Noted  . Obstructive sleep apnea   . Generalized anxiety disorder   . Major depressive disorder   . Weakness 12/22/2019  . Muscle fasciculation 12/22/2019  . Fatigue 10/30/2019  . History of COVID-19 08/2019  . Hypothyroidism 04/16/2019  . Internal hemorrhoids 08/21/2018  . Acute pericarditis   . Globus sensation   . Essential hypertension   . Empty sella syndrome   . Chest pain with low risk for cardiac etiology 12/22/2015    Tempie Donning, PT, DPT 06/15/2020, 12:00 PM  [facility] [address] [place], [place], [address] Phone: [phone]   Fax:  [phone]  Name: Michael Warren MRN: [id number] Date of Birth: 1971-01-07

## 2020-06-15 NOTE — Therapy (Signed)
[facility] [address] [place], [place], [address] Phone: [phone]   Fax:  [phone]  Occupational Therapy Treatment  Patient Details  Name: Michael Warren MRN: [id number] Date of Birth: [birth year] Referring Provider (OT): Dr. Hope Pigeon   Encounter Date: 06/15/2020   OT End of Session - 06/15/20 1210    Visit Number 2    Number of Visits 17    Date for OT Re-Evaluation 09/06/20    Authorization Type UHC 2021:  60 visit limit combined OT, PT, ST    OT Start Time 0930    OT Stop Time 1015    OT Time Calculation (min) 45 min    Activity Tolerance Patient tolerated treatment well    Behavior During Therapy [facility] for tasks assessed/performed           Past Medical History:  Diagnosis Date  . Acute pericarditis   . Brain fog    stemming from COVID-19  . Empty sella syndrome    partial; on hormone replacement  . Esophageal ulcer   . Essential hypertension   . Fatigue 10/30/2019  . Generalized anxiety disorder   . History of COVID-19 08/2019  . Hypothyroidism 04/16/2019  . Internal hemorrhoids 08/21/2018  . Major depressive disorder   . Muscle fasciculation 12/22/2019  . Muscle weakness   . Obstructive sleep apnea    confirmed via sleep test; no current CPAP use  . Ringing in ear     Past Surgical History:  Procedure Laterality Date  . COLONOSCOPY    . URETEROSCOPY      There were no vitals filed for this visit.   Subjective Assessment - 06/15/20 0945    Subjective  Dizziness 6/10 today    Pertinent History Visual impairments after Covid-19 (08/2019) with dizziness, fatigue, brain fog.    PMH:  sleep apnea, Empty sella syndrome (CMS-HCC), Anxiety, Depression, Hypertension, Hypothyroid, Pericarditis, PVC (premature ventricular contraction)  and Tinnitus    Patient Stated Goals to get better    Currently in Pain? Yes    Pain Score 3     Pain Location Generalized    Pain Descriptors / Indicators  Aching;Dull    Pain Type Chronic pain    Pain Onset More than a month ago    Pain Frequency Constant    Aggravating Factors  over-exertion    Pain Relieving Factors unknown           Tabletop scanning: crossing out #'s in order with only 1 error near end of task. Pt with 100% accuracy on 2nd set.  Crossing out double #'s each line and writing times they appear - pt with 2 omissions on 1 line and 1 omission on another line; all other lines correct. However pt said he should not have missed line with 2 errors.  Ambulating in busy gym looking Lt/Rt for head turns calling out cards - pt reports no increase in dizziness with this (remains 6/10).  Discussed safety concerns with driving as pt reports he gets dizzy at times. Pt reports he really tries to limit driving and only drive to/from work. Discussed how glare from sun could make symptoms worse. Also discussed compensations - slower movements and head turns, double checking work and slowing down with work tasks                        OT Short Term Goals - 06/08/20 1840      OT SHORT  TERM GOAL #1   Title Pt will verbalize understanding of visual compensation strategies for ADLs and IADLs.--check STGs12/30/21    Time 4    Period Weeks    Status New      OT SHORT TERM GOAL #2   Title Pt will verbalize understanding of memory and cognitive compensation stategies for ADLs/IADLs including work tasks.    Time 4    Period Weeks    Status New      OT SHORT TERM GOAL #3   Title Pt will be able to perform seated functional visual scanning tasks such as reading, working on computer, scanning for at least without rest break or significantly decreasing speed and with dizziness/nausea rated at 3/10 or less.    Time 4    Period Weeks    Status New      OT SHORT TERM GOAL #4   Title Pt will be able to perform dynamic visual scanning/environmental scanning in busy environment with at least 95% accuracy and dizziness/nausea  rated at 3/10 or less for incr ease with work tasks and community activities/shopping.    Time 4    Period Weeks    Status New      OT SHORT TERM GOAL #5   Title Pt will perform alternating attention for IADLs(cooking) and simulated work tasks with at least 75% accuracy.    Time 4    Period Weeks    Status New             OT Long Term Goals - 06/08/20 1849      OT LONG TERM GOAL #1   Title Pt will be independent with home program for vision, functional cognition, and IADL participation.--check LTGs 09/08/20    Time 12    Period Weeks    Status New      OT LONG TERM GOAL #2   Title Pt will be able to perform environmental scanning with simple divided attention with at least 90% accuracy.    Time 12    Period Weeks    Status New      OT LONG TERM GOAL #3   Title Pt will perform alternating attention for IADLs(cooking) and simulated work tasks with at least 90% accuracy.    Time 12    Period Weeks    Status New      OT LONG TERM GOAL #4   Title Pt will perform visually demanding tasks (reading, scanning, environmental scanning, computer work) for at least 25 min without rest and dizziness/nausea less than or equal to 3/10.    Time 12    Period Weeks    Status New                 Plan - 06/15/20 1210    Clinical Impression Statement Pt continues w/ dizziness today but reports can be sporadic. Pt also with min errors during tabletop scanning activity    OT Occupational Profile and History Detailed Assessment- Review of Records and additional review of physical, cognitive, psychosocial history related to current functional performance    Occupational performance deficits (Please refer to evaluation for details): ADL's;IADL's;Leisure;Social Participation;Work    Games developer / Function / Physical Skills ADL;Vision;IADL    Cognitive Skills Memory;Attention    Rehab Potential Good    Clinical Decision Making Several treatment options, min-mod task modification  necessary    Comorbidities Affecting Occupational Performance: May have comorbidities impacting occupational performance    Modification or Assistance to Complete Evaluation  Min-Moderate modification of tasks or assist with assess necessary to complete eval    OT Duration --   2x/wk for 4 weeks followed by 1x/wk for 8 wks +eval 0r 17 total visits   OT Treatment/Interventions Self-care/ADL training;Aquatic Therapy;Coping strategies training;Therapeutic activities;Cognitive remediation/compensation;Visual/perceptual remediation/compensation;Patient/family education;Therapeutic exercise    Plan continue visual scanning and attention, practice transposing numbers from paper to computer/typing for simulated work tasks    [employer] and Agree with Plan of Care Patient           Patient will benefit from skilled therapeutic intervention in order to improve the following deficits and impairments:   Body Structure / Function / Physical Skills: ADL, Vision, IADL Cognitive Skills: Memory, Attention     Visit Diagnosis: Visuospatial deficit  Unsteadiness on feet  Attention and concentration deficit    Problem List Patient Active Problem List   Diagnosis Date Noted  . Obstructive sleep apnea   . Generalized anxiety disorder   . Major depressive disorder   . Weakness 12/22/2019  . Muscle fasciculation 12/22/2019  . Fatigue 10/30/2019  . History of COVID-19 08/2019  . Hypothyroidism 04/16/2019  . Internal hemorrhoids 08/21/2018  . Acute pericarditis   . Globus sensation   . Essential hypertension   . Empty sella syndrome   . Chest pain with low risk for cardiac etiology 12/22/2015    Kelli Churn, OTR/L 06/15/2020, 12:11 PM   [facility] [address] [place], [place], [address] Phone: [phone]   Fax:  [phone]  Name: Michael Warren MRN: [id number] Date of Birth: 1971/01/07

## 2020-06-17 NOTE — Telephone Encounter (Signed)
Patient's daughter Lindwood Mogel scheduled with Dr. Elease Hashimoto on 07/12/20.

## 2020-06-22 ENCOUNTER — Encounter: Payer: Self-pay | Admitting: *Deleted

## 2020-06-22 ENCOUNTER — Other Ambulatory Visit: Payer: Self-pay

## 2020-06-22 ENCOUNTER — Ambulatory Visit: Payer: 59 | Admitting: Physical Therapy

## 2020-06-22 ENCOUNTER — Ambulatory Visit: Payer: 59 | Admitting: *Deleted

## 2020-06-22 DIAGNOSIS — R41842 Visuospatial deficit: Secondary | ICD-10-CM | POA: Diagnosis not present

## 2020-06-22 DIAGNOSIS — R2689 Other abnormalities of gait and mobility: Secondary | ICD-10-CM

## 2020-06-22 DIAGNOSIS — R2681 Unsteadiness on feet: Secondary | ICD-10-CM

## 2020-06-22 DIAGNOSIS — R41840 Attention and concentration deficit: Secondary | ICD-10-CM

## 2020-06-22 NOTE — Patient Instructions (Signed)
Memory Compensation Strategies  1. Use "WARM" strategy. W= write it down A=  associate it R=  repeat it M=  make a mental picture  2. You can keep a Glass blower/designer. Use a 3-ring notebook with sections for the following:  calendar, important names and phone numbers, medications, doctors' names/phone numbers, "to do list"/reminders, and a section to journal what you did each day  3. Use a calendar to write important information down.  4. Write yourself a schedule for the day.  This can be placed on the calendar or in a separate section of the Memory Notebook.  Keeping a regular schedule can help memory.  5. Use medication organizer with sections for each day or morning/evening pills  You may need help loading it.  6. Keep a basket, or pegboard by the door.   Place items that you need to take out with you in the basket or on the pegboard.  You may also want to include a message board for reminders.  7. Use sticky notes. Place sticky notes with reminders in a place where the task is performed.  For example:  "turn off the stove" placed by the stove, "lock the door" placed on the door at eye level, "take your medications" on the bathroom mirror or by the place where you normally take your medications  8. Use alarms/timers.  Use while cooking to remind yourself to check on food or as a reminder to take your medicine, or as a reminder to make a call, or as a reminder to perform another task, etc.  9. Use a small tape recorder to record important information and notes for yourself.

## 2020-06-22 NOTE — Therapy (Signed)
[facility] [address] [place], [place], [address] Phone: [phone]   Fax:  [phone]  Occupational Therapy Treatment  Patient Details  Name: Michael Denverrennon Warren MRN: [id number] Date of Birth: Feb 03, 1971 Referring Provider (OT): Dr. Hope PigeonJohn Baratta   Encounter Date: 06/22/2020   OT End of Session - 06/22/20 1157    Visit Number 3    Number of Visits 17    Date for OT Re-Evaluation 09/06/20    Authorization Type UHC 2021:  60 visit limit combined OT, PT, ST    OT Start Time 1100    OT Stop Time 1141    OT Time Calculation (min) 41 min    Activity Tolerance Patient tolerated treatment well    Behavior During [facility] tasks assessed/performed           Past Medical History:  Diagnosis Date  . Acute pericarditis   . Brain fog    stemming from COVID-19  . Empty sella syndrome    partial; on hormone replacement  . Esophageal ulcer   . Essential hypertension   . Fatigue 10/30/2019  . Generalized anxiety disorder   . History of COVID-19 08/2019  . Hypothyroidism 04/16/2019  . Internal hemorrhoids 08/21/2018  . Major depressive disorder   . Muscle fasciculation 12/22/2019  . Muscle weakness   . Obstructive sleep apnea    confirmed via sleep test; no current CPAP use  . Ringing in ear     Past Surgical History:  Procedure Laterality Date  . COLONOSCOPY    . URETEROSCOPY      There were no vitals filed for this visit.   Subjective Assessment - 06/22/20 1102    Subjective  Pt reports dizziness 6/10 following PT this morning. He states that it "stays aroud a 5"    Pertinent History Visual impairments after Covid-19 (08/2019) with dizziness, fatigue, brain fog.    PMH:  sleep apnea, Empty sella syndrome (CMS-HCC), Anxiety, Depression, Hypertension, Hypothyroid, Pericarditis, PVC (premature ventricular contraction)  and Tinnitus    Currently in Pain? Yes    Pain Score 4     Pain Location Other (Comment)    Usual joint pain per pt report   Pain Orientation Other (Comment)   Generalized all over   Pain Type Acute pain    Pain Onset More than a month ago    Pain Frequency Constant    Aggravating Factors  Over-exertion    Pain Relieving Factors Unknown    Multiple Pain Sites No                        OT Treatments/Exercises (OP) - 06/22/20 0001      ADLs   ADL Comments Brief discussion of memory strategies during ADL's and at home. See some recommendations discussed below. Pt reports that he and his wife "help" each other PRN with memory and assist each other as able. "I get tired easily" Pt also reports that he does better at work in the mornings when the office is quiet w/o a lot of distractions, as lunch time approaches and staff comes back for lunch and office becomes louder/busier, he has more difficulty and often has "to leave to go home" due to being overwhelmed by noise, dizziness and brain fog per his report.   OT turned off radio in clinic as pt was clearly distracted by noise, music and other conversations - plugging his ears during tasks.  Cognitive Exercises   Other Cognitive Exercises 1 Discussed memory strategies in clinic today. Issued and reviewed handout with pt. He reports that he is currently using multiple strategies that have been suggested - uses timers on phone and while cooking as he reports having "forgotten" what he was doing while cooking and sense of smell has not returned. Also uses pill organizer. Discussed making a list of tasks to complete at work and checking off items when completed and then moving onto another task after taking PRN rest breaks throughout the day.      Visual/Perceptual Exercises   Word Finding 4  letter word search with moderate difficulty secondary to dizziness today prior to and after PT per pt report.   Pt reports consistent diziness of at least 5/10 that worsens in difficulty in loud, busy gym/distracting environment. Covers ears  to assist with increasing focus to tabletop scanning tasks.   Scanning Tabletop    Scanning - Tabletop Word search and letter find. Pt reports "Losing focus" secondary to brain fog in moderately distracting/busy environment today.    Other Exercises Computer exercises copying paragraph "Astronauts" 15 WPM, 2 mistakes, did not finish before time ran out, 93% accuracy. "Aesop's Fables" 29 wpm, 91 % accuracy, did not complete task in 2 min time frame. Transposing numbers/names from paper to computer while typing for simulated work tasks with 100% accuracy & moderate difficulty due to smaller font on computer per pt report.                  OT Education - 06/22/20 1155    Education Details Memory strategies, visual scanning, taking rest breaks PRN during day and working in quiet environment when able.    Person(s) Educated Patient    Methods Explanation;Demonstration;Verbal cues;Handout    Comprehension Verbalized understanding            OT Short Term Goals - 06/08/20 1840      OT SHORT TERM GOAL #1   Title Pt will verbalize understanding of visual compensation strategies for ADLs and IADLs.--check STGs12/30/21    Time 4    Period Weeks    Status New      OT SHORT TERM GOAL #2   Title Pt will verbalize understanding of memory and cognitive compensation stategies for ADLs/IADLs including work tasks.    Time 4    Period Weeks    Status New      OT SHORT TERM GOAL #3   Title Pt will be able to perform seated functional visual scanning tasks such as reading, working on computer, scanning for at least without rest break or significantly decreasing speed and with dizziness/nausea rated at 3/10 or less.    Time 4    Period Weeks    Status New      OT SHORT TERM GOAL #4   Title Pt will be able to perform dynamic visual scanning/environmental scanning in busy environment with at least 95% accuracy and dizziness/nausea rated at 3/10 or less for incr ease with work tasks and  community activities/shopping.    Time 4    Period Weeks    Status New      OT SHORT TERM GOAL #5   Title Pt will perform alternating attention for IADLs(cooking) and simulated work tasks with at least 75% accuracy.    Time 4    Period Weeks    Status New             OT Long Term  Goals - 06/08/20 1849      OT LONG TERM GOAL #1   Title Pt will be independent with home program for vision, functional cognition, and IADL participation.--check LTGs 09/08/20    Time 12    Period Weeks    Status New      OT LONG TERM GOAL #2   Title Pt will be able to perform environmental scanning with simple divided attention with at least 90% accuracy.    Time 12    Period Weeks    Status New      OT LONG TERM GOAL #3   Title Pt will perform alternating attention for IADLs(cooking) and simulated work tasks with at least 90% accuracy.    Time 12    Period Weeks    Status New      OT LONG TERM GOAL #4   Title Pt will perform visually demanding tasks (reading, scanning, environmental scanning, computer work) for at least 25 min without rest and dizziness/nausea less than or equal to 3/10.    Time 12    Period Weeks    Status New                 Plan - 06/22/20 1159    Clinical Impression Statement Pt reports increased dizziness today and rates as 6/10 - reports increased distractions and difficulty focusing in busy gym environment. He should benefit from rest breaks as well as implementation of memory strategies to assist with staying on task(s). Reports reading and focusing/attention continues to be difficult.    OT Occupational Profile and History Detailed Assessment- Review of Records and additional review of physical, cognitive, psychosocial history related to current functional performance    Occupational performance deficits (Please refer to evaluation for details): ADL's;IADL's;Leisure;Social Participation;Work    Games developer / Function / Physical Skills ADL;Vision;IADL     Cognitive Skills Memory;Attention    Rehab Potential Good    Clinical Decision Making Several treatment options, min-mod task modification necessary    Comorbidities Affecting Occupational Performance: May have comorbidities impacting occupational performance    Modification or Assistance to Complete Evaluation  Min-Moderate modification of tasks or assist with assess necessary to complete eval    OT Frequency Other (comment)   See below   OT Duration --   2x/week for 4 weeks followed by 1x/week for 8 weeks, +eval = 17 visits OT   OT Treatment/Interventions Self-care/ADL training;Aquatic Therapy;Coping strategies training;Therapeutic activities;Cognitive remediation/compensation;Visual/perceptual remediation/compensation;Patient/family education;Therapeutic exercise    Plan Attention, coping strategies for different environments (quiet vs moderately distracting etc), cognitive compensation strategies for work/home.    Consulted and Agree with Plan of Care Patient           Patient will benefit from skilled therapeutic intervention in order to improve the following deficits and impairments:   Body Structure / Function / Physical Skills: ADL,Vision,IADL Cognitive Skills: Memory,Attention     Visit Diagnosis: Visuospatial deficit  Unsteadiness on feet  Attention and concentration deficit    Problem List Patient Active Problem List   Diagnosis Date Noted  . Obstructive sleep apnea   . Generalized anxiety disorder   . Major depressive disorder   . Weakness 12/22/2019  . Muscle fasciculation 12/22/2019  . Fatigue 10/30/2019  . History of COVID-19 08/2019  . Hypothyroidism 04/16/2019  . Internal hemorrhoids 08/21/2018  . Acute pericarditis   . Globus sensation   . Essential hypertension   . Empty sella syndrome   . Chest pain with low risk for  cardiac etiology 12/22/2015    Alm Bustard, OTR/L 06/22/2020, 12:08 PM  [facility] [address] [place], [place], [address] Phone: [phone]   Fax:  [phone]  Name: Michael Warren MRN: [id number] Date of Birth: 06-Jun-1971

## 2020-06-23 ENCOUNTER — Encounter: Payer: Self-pay | Admitting: Physical Therapy

## 2020-06-23 NOTE — Therapy (Signed)
[facility] [address] [place], [place], [address] Phone: [phone]   Fax:  [phone]  Physical Therapy Treatment  Patient Details  Name: Michael Warren MRN: [id number] Date of Birth: [birth year] Referring Provider (PT): Hope Pigeon, MD   Encounter Date: 06/22/2020   PT End of Session - 06/23/20 1738    Visit Number 6    Number of Visits 7    Date for PT Re-Evaluation 07/02/20    Authorization Type UHC    PT Start Time 1020    PT Stop Time 1100    PT Time Calculation (min) 40 min    Activity Tolerance Patient tolerated treatment well    Behavior During Therapy [facility] for tasks assessed/performed           Past Medical History:  Diagnosis Date   Acute pericarditis    Brain fog    stemming from COVID-19   Empty sella syndrome    partial; on hormone replacement   Esophageal ulcer    Essential hypertension    Fatigue 10/30/2019   Generalized anxiety disorder    History of COVID-19 08/2019   Hypothyroidism 04/16/2019   Internal hemorrhoids 08/21/2018   Major depressive disorder    Muscle fasciculation 12/22/2019   Muscle weakness    Obstructive sleep apnea    confirmed via sleep test; no current CPAP use   Ringing in ear     Past Surgical History:  Procedure Laterality Date   COLONOSCOPY     URETEROSCOPY      There were no vitals filed for this visit.   Subjective Assessment - 06/22/20 1025    Subjective Pt reports he had increased dizziness which started last Thursday - doesn't know why; reports he has dizziness all the time, but it varies in intensity    Patient Stated Goals Improve unsteadiness    Currently in Pain? Yes    Pain Score 2     Pain Location --   usual joint pain   Pain Orientation --   generalized all over   Pain Descriptors / Indicators Aching;Discomfort    Pain Type Chronic pain    Pain Onset More than a month ago    Pain Frequency Constant                              [facility] Adult PT Treatment/Exercise - 06/23/20 0001      Ambulation/Gait   Ambulation/Gait Yes    Ambulation/Gait Assistance 5: Supervision    Ambulation/Gait Assistance Details amb. with abrupt stop and turn - 6 reps    Ambulation Distance (Feet) 150 Feet    Assistive device None    Gait Pattern Within Functional Limits    Ambulation Surface Level;Indoor      Exercises   Exercises Knee/Hip      Knee/Hip Exercises: Aerobic   Elliptical level 3.0 1" forward, 1" backward               Balance Exercises - 06/23/20 0001      Balance Exercises: Standing   Standing Eyes Opened Wide (BOA);Head turns;Foam/compliant surface;5 reps   horizontal & vertical   Standing Eyes Closed Head turns;Wide (BOA);Narrow base of support (BOS);5 reps   horizontal & vertical   Rockerboard Anterior/posterior;EO;EC;10 reps    Gait with Head Turns Forward;2 reps   40'   Marching Foam/compliant surface;Static;10 reps   on 1 pillow in corner   Other  Standing Exercises pt performed rotations standing on pillows - turning side to side and then diagonals "X" 5 reps each with EO and EC               PT Short Term Goals - 06/23/20 1743      PT SHORT TERM GOAL #1   Title PT will perform 6 MWT to create goal for endurance in the next 1-2 visits    Time 2    Period Weeks    Status Achieved    Target Date 06/04/20             PT Long Term Goals - 06/23/20 1743      PT LONG TERM GOAL #1   Title Pt will be independent with performing HEP for endurance and balance  (ALL LTG due by 07/02/20)    Time 6    Period Weeks    Status New      PT LONG TERM GOAL #2   Title Pt will have improved FGA to >/= 28/30    Baseline 24/30 on eval (05/21/20)    Time 6    Period Weeks    Status New      PT LONG TERM GOAL #3   Title Pt will report symptoms </=1/5 when checking motion sensitivities    Baseline at most 3/5 in certain positions    Time 6    Period Weeks     Status New      PT LONG TERM GOAL #4   Title Pt will have improved DHI to </=60/100 to decrease to moderate severity    Baseline 86/100 currently rated as severe    Time 6    Period Weeks    Status New      PT LONG TERM GOAL #5   Title Pt will have improved FOTO score to >/= 68%    Baseline 45% score at eval (05/21/20)    Time 6    Period Weeks    Status New      PT LONG TERM GOAL #6   Title Pt will increase distance on 6 minute walk by 200' and will report </= 4/10 dizziness after performing    Baseline 831', 7-8/10 dizziness    Time 6    Period Weeks    Status New                 Plan - 06/23/20 1739    Clinical Impression Statement Pt improving with maintaining balance on compliant surfaces with EO and with EC.  Pt does continue to exhibit motion sensitivity and self regulated gait speed is decreased, however, pt is able to increase velocity with verbal cues to increase speed.  Pt reported decr. strength in bil. LE's with elliptical exercise.  Cont with POC.    Personal Factors and Comorbidities Age    Examination-Activity Limitations Locomotion Level;Squat;Stairs;Lift;Bend    Examination-Participation Restrictions Interpersonal Relationship;Occupation;Cleaning;Yard Work;Community Activity    Stability/Clinical Decision Making Evolving/Moderate complexity    Rehab Potential Good    PT Frequency 1x / week    PT Duration 6 weeks    PT Treatment/Interventions ADLs/Self Care Home Management;Electrical Stimulation;Moist Heat;Iontophoresis 4mg /ml Dexamethasone;Cryotherapy;DME Instruction;Gait training;Stair training;Functional mobility training;Therapeutic activities;Therapeutic exercise;Balance training;Neuromuscular re-education;Patient/family education;Manual techniques;Energy conservation;Taping;Vestibular    PT Next Visit Plan Cont with balance/vestibular exercises - No true vertigo; feels like he is rocking on a boat -constant.   Continue to progress x1 viewing,  habituation to walking faster speeds.    Consulted and  Agree with Plan of Care Patient           Patient will benefit from skilled therapeutic intervention in order to improve the following deficits and impairments:  Decreased balance,Decreased endurance,Decreased mobility,Difficulty walking,Dizziness,Decreased activity tolerance  Visit Diagnosis: Unsteadiness on feet  Other abnormalities of gait and mobility     Problem List Patient Active Problem List   Diagnosis Date Noted   Obstructive sleep apnea    Generalized anxiety disorder    Major depressive disorder    Weakness 12/22/2019   Muscle fasciculation 12/22/2019   Fatigue 10/30/2019   History of COVID-19 08/2019   Hypothyroidism 04/16/2019   Internal hemorrhoids 08/21/2018   Acute pericarditis    Globus sensation    Essential hypertension    Empty sella syndrome    Chest pain with low risk for cardiac etiology 12/22/2015    Marjorie Deprey, Donavan Burnet, PT 06/23/2020, 5:44 PM  [facility] [address] [place], [place], [address] Phone: [phone]   Fax:  [phone]  Name: Michael Warren MRN: [id number] Date of Birth: 18-Oct-1970

## 2020-06-25 ENCOUNTER — Ambulatory Visit: Payer: 59

## 2020-06-25 ENCOUNTER — Ambulatory Visit: Payer: 59 | Admitting: Speech Pathology

## 2020-06-25 ENCOUNTER — Other Ambulatory Visit: Payer: Self-pay

## 2020-06-25 ENCOUNTER — Encounter: Payer: Self-pay | Admitting: Speech Pathology

## 2020-06-25 DIAGNOSIS — R2681 Unsteadiness on feet: Secondary | ICD-10-CM

## 2020-06-25 DIAGNOSIS — R42 Dizziness and giddiness: Secondary | ICD-10-CM

## 2020-06-25 DIAGNOSIS — R41842 Visuospatial deficit: Secondary | ICD-10-CM | POA: Diagnosis not present

## 2020-06-25 DIAGNOSIS — R2689 Other abnormalities of gait and mobility: Secondary | ICD-10-CM

## 2020-06-25 DIAGNOSIS — R262 Difficulty in walking, not elsewhere classified: Secondary | ICD-10-CM

## 2020-06-25 DIAGNOSIS — R41841 Cognitive communication deficit: Secondary | ICD-10-CM

## 2020-06-25 NOTE — Therapy (Signed)
[facility] [address] [place], [place], [address] Phone: [phone]   Fax:  [phone]  Speech Language Pathology Treatment  Patient Details  Name: Michael Warren MRN: [id number] Date of Birth: 01/09/1971 Referring Provider (SLP): Hope Pigeon, MD   Encounter Date: 06/25/2020   End of Session - 06/25/20 1403    Visit Number 2    Number of Visits 16    Date for SLP Re-Evaluation 09/06/20    SLP Start Time 0932    SLP Stop Time  1014    SLP Time Calculation (min) 42 min           Past Medical History:  Diagnosis Date  . Acute pericarditis   . Brain fog    stemming from COVID-19  . Empty sella syndrome    partial; on hormone replacement  . Esophageal ulcer   . Essential hypertension   . Fatigue 10/30/2019  . Generalized anxiety disorder   . History of COVID-19 08/2019  . Hypothyroidism 04/16/2019  . Internal hemorrhoids 08/21/2018  . Major depressive disorder   . Muscle fasciculation 12/22/2019  . Muscle weakness   . Obstructive sleep apnea    confirmed via sleep test; no current CPAP use  . Ringing in ear     Past Surgical History:  Procedure Laterality Date  . COLONOSCOPY    . URETEROSCOPY      There were no vitals filed for this visit.   Subjective Assessment - 06/25/20 0933    Subjective "The usual steady ache all over"    Currently in Pain? Yes    Pain Score 3     Pain Orientation Other (Comment)   all over body ache   Pain Descriptors / Indicators Other (Comment)   all over boday ache   Pain Type Chronic pain    Pain Onset Today    Pain Frequency Constant    Aggravating Factors  over exertion                 ADULT SLP TREATMENT - 06/25/20 0936      General Information   Behavior/Cognition Alert;Cooperative;Pleasant mood      Treatment Provided   Treatment provided Cognitive-Linquistic      Cognitive-Linquistic Treatment   Treatment focused on  Cognition;Patient/family/caregiver education    Skilled Treatment Pt continues to report brain fatigue, poor endurance, sensory hyperstimulation. He is distracted at work due to noise. Educated pt in external aids for sesory management when needed. He is missing some meds despite using pill organizer. We generated strategy of setting recurring alarm and hitting snooze until he takes meds. He is using to do list in his office and on his phone. Michael Warren is to take more regular breaks at work to improve accuracy of paperwork. He reports he double and triple checks his work and finds errors in transposing numbers. He reports clutter at home, as well as difficulty finding items that are right in front of him. Educated pt re: affect clutter has on attnetion and processing.      Assessment / Recommendations / Plan   Plan Continue with current plan of care      Progression Toward Goals   Progression toward goals Progressing toward goals            SLP Education - 06/25/20 1401    Education Details compensations for attention, sensory hypersensitivity, and memory    Person(s) Educated Patient    Methods Explanation;Demonstration;Verbal cues;Handout    Comprehension  Verbalized understanding;Returned demonstration;Verbal cues required            SLP Short Term Goals - 06/25/20 1402      SLP SHORT TERM GOAL #1   Title pt will complete functional mod complex alternating attention tasks with modified independence in 3 sessions    Time 4    Period Weeks    Status On-going      SLP SHORT TERM GOAL #2   Title pt will demo functional linguistic processing (auditory and written intormation) in structured tasks with rare min-mod A in 3 sessions    Time 4    Period Weeks    Status On-going      SLP SHORT TERM GOAL #3   Title pt will demo appropriate skills with appointment management, completing to-do lists, and other more simple functional tasks involving problem solving and organization in 3  sessions    Time 4    Period Weeks    Status On-going            SLP Long Term Goals - 06/25/20 1403      SLP LONG TERM GOAL #1   Title pt will complete functional simple tasks using divided attention in 3 sessions    Time 8    Period Weeks   or 17 sessions,for all LTGs   Status On-going      SLP LONG TERM GOAL #2   Title pt will demo functional linguistic processing (auditory and written intormation) in work-like tasks in 3 sessions    Time 8    Period Weeks    Status On-going      SLP LONG TERM GOAL #3   Title pt will demo appropriate skills with financial management, scheduling, and other functional tasks involving problem solving and organization in 3 sessions    Time 8    Period Weeks    Status On-going            Plan - 06/25/20 1402    Clinical Impression Statement Michael Warren presents today with cognitive-linguistics following contracing COVID-19 in Februray 2021. See body of the evaluation for specific examples of how these deficits are affecting his daily living tasks at home and at work. Pt has experienced frustration regarding his deficits. Neuropsych testing recently found pt scores were all WNL except for one score (indicating an encoding deficit) outside/below WNL This finding coincides with pt's descriptions of his current defiicts. Based on his anecdotal examples he has provided today this SLP believes Michael Warren is having more difficulty with attention (and thus memory), organization/problem solving, and processing. Pt would beneift from skilled ST targeting attention, organization and problem solving in terms of linguistics. OT will address pt's memory strategies.    Speech Therapy Frequency 2x / week    Duration 8 weeks   17 visits   Treatment/Interventions Environmental controls;Functional tasks;Compensatory techniques;Cueing hierarchy;SLP instruction and feedback;Internal/external aids;Patient/family education;Cognitive reorganization    Potential to Achieve Goals  Good           Patient will benefit from skilled therapeutic intervention in order to improve the following deficits and impairments:   Cognitive communication deficit    Problem List Patient Active Problem List   Diagnosis Date Noted  . Obstructive sleep apnea   . Generalized anxiety disorder   . Major depressive disorder   . Weakness 12/22/2019  . Muscle fasciculation 12/22/2019  . Fatigue 10/30/2019  . History of COVID-19 08/2019  . Hypothyroidism 04/16/2019  . Internal hemorrhoids 08/21/2018  . Acute  pericarditis   . Globus sensation   . Essential hypertension   . Empty sella syndrome   . Chest pain with low risk for cardiac etiology 12/22/2015    Dyanna Seiter, Radene Journey MS, CCC-SLP 06/25/2020, 2:04 PM  [facility] [address] [place], [place], [address] Phone: [phone]   Fax:  [phone]   Name: Michael Warren MRN: [id number] Date of Birth: 10-30-1970

## 2020-06-25 NOTE — Therapy (Signed)
[facility] [address] [place], [place], [address] Phone: [phone]   Fax:  [phone]  Physical Therapy Treatment/Re-Certification  Patient Details  Name: Michael Warren MRN: [id number] Date of Birth: Jul 27, 1970 Referring Provider (PT): Hope Pigeon, MD   Encounter Date: 06/25/2020   PT End of Session - 06/25/20 0849    Visit Number 7    Number of Visits 15    Date for PT Re-Evaluation 09/23/20   POC for 8 weeks, Cert for 90 days   Authorization Type [employer]    PT Start Time [id number]    PT Stop Time 0929    PT Time Calculation (min) 42 min    Activity Tolerance Patient tolerated treatment well    Behavior During Therapy [facility] for tasks assessed/performed           Past Medical History:  Diagnosis Date  . Acute pericarditis   . Brain fog    stemming from COVID-19  . Empty sella syndrome    partial; on hormone replacement  . Esophageal ulcer   . Essential hypertension   . Fatigue 10/30/2019  . Generalized anxiety disorder   . History of COVID-19 08/2019  . Hypothyroidism 04/16/2019  . Internal hemorrhoids 08/21/2018  . Major depressive disorder   . Muscle fasciculation 12/22/2019  . Muscle weakness   . Obstructive sleep apnea    confirmed via sleep test; no current CPAP use  . Ringing in ear     Past Surgical History:  Procedure Laterality Date  . COLONOSCOPY    . URETEROSCOPY      There were no vitals filed for this visit.   Subjective Assessment - 06/25/20 0850    Subjective Patient reports on Monday had extreme dizziness that caused him to miss work.    Patient Stated Goals Improve unsteadiness    Currently in Pain? Yes    Pain Score 2     Pain Location Head    Pain Orientation Right;Anterior;Posterior    Pain Descriptors / Indicators Headache    Pain Type Acute pain    Pain Onset Today              [facility] PT Assessment - 06/25/20 0853      Assessment   Medical Diagnosis Post-COVID; hx  of COVID Feb 2021    Referring Provider (PT) Hope Pigeon, MD      Observation/Other Assessments   Focus on Therapeutic Outcomes (FOTO)  45%    Other Surveys  Dizziness Handicap Inventory (DHI)    Dizziness Handicap Inventory (DHI)  92/100      Functional Gait  Assessment   Gait assessed  Yes    Gait Level Surface Walks 20 ft in less than 7 sec but greater than 5.5 sec, uses assistive device, slower speed, mild gait deviations, or deviates 6-10 in outside of the 12 in walkway width.    Change in Gait Speed Able to change speed, demonstrates mild gait deviations, deviates 6-10 in outside of the 12 in walkway width, or no gait deviations, unable to achieve a major change in velocity, or uses a change in velocity, or uses an assistive device.    Gait with Horizontal Head Turns Performs head turns smoothly with slight change in gait velocity (eg, minor disruption to smooth gait path), deviates 6-10 in outside 12 in walkway width, or uses an assistive device.   increased dizziness   Gait with Vertical Head Turns Performs head turns with no change in gait.  Deviates no more than 6 in outside 12 in walkway width.   increase in dizziness (7/10) after completion   Gait and Pivot Turn Pivot turns safely in greater than 3 sec and stops with no loss of balance, or pivot turns safely within 3 sec and stops with mild imbalance, requires small steps to catch balance.    Step Over Obstacle Is able to step over 2 stacked shoe boxes taped together (9 in total height) without changing gait speed. No evidence of imbalance.    Gait with Narrow Base of Support Is able to ambulate for 10 steps heel to toe with no staggering.    Gait with Eyes Closed Walks 20 ft, uses assistive device, slower speed, mild gait deviations, deviates 6-10 in outside 12 in walkway width. Ambulates 20 ft in less than 9 sec but greater than 7 sec.    Ambulating Backwards Walks 20 ft, no assistive devices, good speed, no evidence for imbalance,  normal gait    Steps Alternating feet, no rail.    Total Score 25    FGA comment: 25/30               Vestibular Assessment - 06/25/20 0001      Positional Sensitivities   Sit to Supine No dizziness    Supine to Left Side Mild dizziness    Supine to Right Side Mild dizziness    Supine to Sitting Mild dizziness    Nose to Right Knee No dizziness    Right Knee to Sitting Moderate dizziness    Nose to Left Knee No dizziness    Left Knee to Sitting Moderate dizziness    Head Turning x 5 Moderate dizziness    Head Nodding x 5 Lightheadedness    Rolling Right Lightheadedness    Rolling Left Lightheadedness    Positional Sensitivities Comments intermittent rest breaks throughout completion of MSQ            [facility] Adult PT Treatment/Exercise - 06/25/20 0001      Ambulation/Gait   Ambulation/Gait Yes    Ambulation/Gait Assistance 5: Supervision    Ambulation/Gait Assistance Details throughout therapy gym with activities    Assistive device None    Gait Pattern Within Functional Limits    Ambulation Surface Level;Indoor              PT Short Term Goals - 06/23/20 1743      PT SHORT TERM GOAL #1   Title PT will perform 6 MWT to create goal for endurance in the next 1-2 visits    Time 2    Period Weeks    Status Achieved    Target Date 06/04/20             PT Long Term Goals - 06/25/20 7341      PT LONG TERM GOAL #1   Title Pt will be independent with performing HEP for endurance and balance  (ALL LTG due by 07/02/20)    Baseline reports independence with HEP, completing daily    Time 6    Period Weeks    Status Achieved      PT LONG TERM GOAL #2   Title Pt will have improved FGA to >/= 28/30    Baseline 24/30 on eval (05/21/20); 25/30    Time 6    Period Weeks    Status Not Met      PT LONG TERM GOAL #3   Title Pt will report symptoms </=1/5 when checking  motion sensitivities    Baseline continue to have moderate dizziness (3/5) with positional  changes,, some have improved to 1/5    Time 6    Period Weeks    Status Partially Met      PT LONG TERM GOAL #4   Title Pt will have improved DHI to </=60/100 to decrease to moderate severity    Baseline 86/100 currently rated as severe; 92/100 on 12/17    Time 6    Period Weeks    Status Not Met      PT LONG TERM GOAL #5   Title Pt will have improved FOTO score to >/= 68%    Baseline 45% score at eval (05/21/20); 45% on 12/17    Time 6    Period Weeks    Status Not Met      PT LONG TERM GOAL #6   Title Pt will increase distance on 6 minute walk by 200' and will report </= 4/10 dizziness after performing    Baseline 831', 7-8/10 dizziness    Time 6    Period Weeks    Status On-going          Updated Short Term Goals:   PT Short Term Goals - 06/25/20 1110      PT SHORT TERM GOAL #1   Title Patient will be able to ambulate with horizontal/vertical head turns x 100 ft with reports of </= 5/10 dizziness with completion (ALL STGs: 07/23/20)    Baseline 7/10 dizziness    Time 4    Period Weeks    Status New    Target Date 07/23/20          Updated Long Term Goals:   PT Long Term Goals - 06/25/20 1111      PT LONG TERM GOAL #1   Title Pt will be independent with performing vestibular/balance HEP (ALL LTG due by 08/21/19)    Baseline continue to progress HEP    Time 8    Period Weeks    Status Revised    Target Date 08/20/20      PT LONG TERM GOAL #2   Title Pt will have improved FGA to >/= 28/30    Baseline 24/30 on eval (05/21/20); 25/30    Time 8    Period Weeks    Status On-going      PT LONG TERM GOAL #3   Title Pt will report symptoms </=1/5 with completion of MSQ    Baseline continue to have moderate dizziness (3/5) with positional changes,, some have improved to 1/5    Time 8    Period Weeks    Status Revised      PT LONG TERM GOAL #4   Title Pt will have improved DHI to </=60/100 to decrease to moderate severity    Baseline 86/100 currently rated  as severe; 92/100 on 12/17    Time 8    Period Weeks    Status On-going      PT LONG TERM GOAL #5   Title Pt will have improved FOTO score to >/= 68%    Baseline 45% score at eval (05/21/20); 45% on 12/17    Time 8    Period Weeks    Status On-going      PT LONG TERM GOAL #6   Title Pt will increase distance on 6 minute walk by 200' and will report </= 4/10 dizziness after performing    Baseline 831', 7-8/10 dizziness; not assessed due to  increased symptoms    Time 6    Period Weeks    Status On-going                 Plan - 06/25/20 0933    Clinical Impression Statement Today's skilled PT session included assesment of patient's progress toward LTG. Patient able to meet LTG #1, and has made progress toward LTG's #2 and #3. Patient improving FGA to 25/30 today, but still has increased dizziness with head turns and full body turns. Patient demo some improvements in motion senstivity with bed mobility but still have moderate dizziness overall with positional changes. Patient is making slow steady progress with PT services, and will continue to benefit from skilled PT services to address dizziness, motion senstivity to allow improved tolerance for work/functional tasks.    Personal Factors and Comorbidities Age    Examination-Activity Limitations Locomotion Level;Squat;Stairs;Lift;Bend    Examination-Participation Restrictions Interpersonal Relationship;Occupation;Cleaning;Yard Work;Community Activity    Stability/Clinical Decision Making Evolving/Moderate complexity    Rehab Potential Good    PT Frequency 1x / week    PT Duration 6 weeks    PT Treatment/Interventions ADLs/Self Care Home Management;Electrical Stimulation;Moist Heat;Iontophoresis 4mg /ml Dexamethasone;Cryotherapy;DME Instruction;Gait training;Stair training;Functional mobility training;Therapeutic activities;Therapeutic exercise;Balance training;Neuromuscular re-education;Patient/family education;Manual  techniques;Energy conservation;Taping;Vestibular    PT Next Visit Plan Cont with balance/vestibular exercises - No true vertigo; feels like he is rocking on a boat -constant.   Continue to progress x1 viewing, habituation to walking faster speeds.    Consulted and Agree with Plan of Care Patient           Patient will benefit from skilled therapeutic intervention in order to improve the following deficits and impairments:  Decreased balance,Decreased endurance,Decreased mobility,Difficulty walking,Dizziness,Decreased activity tolerance  Visit Diagnosis: Unsteadiness on feet  Other abnormalities of gait and mobility  Dizziness and giddiness  Difficulty in walking, not elsewhere classified     Problem List Patient Active Problem List   Diagnosis Date Noted  . Obstructive sleep apnea   . Generalized anxiety disorder   . Major depressive disorder   . Weakness 12/22/2019  . Muscle fasciculation 12/22/2019  . Fatigue 10/30/2019  . History of COVID-19 08/2019  . Hypothyroidism 04/16/2019  . Internal hemorrhoids 08/21/2018  . Acute pericarditis   . Globus sensation   . Essential hypertension   . Empty sella syndrome   . Chest pain with low risk for cardiac etiology 12/22/2015    Jones Bales, PT, DPT 06/25/2020, 11:09 AM  [facility] [address] [place], [place], [address] Phone: [phone]   Fax:  [phone]  Name: Thornton Dohrmann MRN: [id number] Date of Birth: August 30, 1970

## 2020-06-25 NOTE — Patient Instructions (Addendum)
  Get a 3 ring binder and keep a section for PT/OT/ST and you calendar  Consider noise cancelling headphones when you have to focus at work  Consider use of visor and/or sunglasses in a store or at work when you really have focus  Take breaks in quiet room or your car  Take 10 minute breaks every 30 minutes where you can be quiet and calm   Get the persons attention before you speak  Use eye contact and face the person you are speaking to  Be in close proximity to the person you are speaking to  Turn down any noise in the environment such as the TV, walk away from loud appliances, air conditioners, fans, dish washers etc  Be in quiet room and face to face when you have to have an important conversation  Repeat back what you heard  Take notes immediately afterwards  Great job eliminating distractions in the kitchen and using timers  Clutter is very hard on your brain - it is too hard to visually focus - I would ask that you try to reduce the clutter in your life  [place] job double checking you work!! Keep it up!  No one can see your brain - you look good. Even people who know and understand may forget  Keep clearing out emails - this is also a source of clutter which is hard on your brain

## 2020-06-29 ENCOUNTER — Ambulatory Visit: Payer: 59 | Admitting: Occupational Therapy

## 2020-06-29 ENCOUNTER — Ambulatory Visit: Payer: 59 | Admitting: Physical Therapy

## 2020-07-06 ENCOUNTER — Ambulatory Visit: Payer: 59 | Admitting: Physical Therapy

## 2020-07-06 ENCOUNTER — Ambulatory Visit: Payer: 59 | Admitting: Occupational Therapy

## 2020-07-08 ENCOUNTER — Ambulatory Visit: Payer: 59 | Admitting: Physical Therapy

## 2020-07-08 ENCOUNTER — Ambulatory Visit: Payer: 59 | Admitting: Occupational Therapy

## 2020-07-13 ENCOUNTER — Ambulatory Visit: Payer: 59

## 2020-07-13 ENCOUNTER — Ambulatory Visit: Payer: 59 | Attending: *Deleted | Admitting: Occupational Therapy

## 2020-07-13 ENCOUNTER — Ambulatory Visit: Payer: 59 | Admitting: Physical Therapy

## 2020-07-13 ENCOUNTER — Other Ambulatory Visit: Payer: Self-pay

## 2020-07-13 DIAGNOSIS — R41842 Visuospatial deficit: Secondary | ICD-10-CM | POA: Diagnosis present

## 2020-07-13 DIAGNOSIS — R2681 Unsteadiness on feet: Secondary | ICD-10-CM

## 2020-07-13 DIAGNOSIS — R41840 Attention and concentration deficit: Secondary | ICD-10-CM | POA: Insufficient documentation

## 2020-07-13 DIAGNOSIS — R262 Difficulty in walking, not elsewhere classified: Secondary | ICD-10-CM | POA: Diagnosis present

## 2020-07-13 DIAGNOSIS — R41841 Cognitive communication deficit: Secondary | ICD-10-CM

## 2020-07-13 DIAGNOSIS — R41844 Frontal lobe and executive function deficit: Secondary | ICD-10-CM | POA: Insufficient documentation

## 2020-07-13 DIAGNOSIS — R42 Dizziness and giddiness: Secondary | ICD-10-CM | POA: Insufficient documentation

## 2020-07-13 NOTE — Therapy (Signed)
[facility] [address] [place], [place], [address] Phone: [phone]   Fax:  [phone]  Speech Language Pathology Treatment  Patient Details  Name: Michael Warren MRN: [id number] Date of Birth: 27-Feb-1971 Referring Provider (SLP): Hope Pigeon, MD   Encounter Date: 07/13/2020   End of Session - 07/13/20 1752    Visit Number 3    Number of Visits 16    Date for SLP Re-Evaluation 09/06/20    SLP Start Time 0850    SLP Stop Time  0930    SLP Time Calculation (min) 40 min           Past Medical History:  Diagnosis Date  . Acute pericarditis   . Brain fog    stemming from COVID-19  . Empty sella syndrome    partial; on hormone replacement  . Esophageal ulcer   . Essential hypertension   . Fatigue 10/30/2019  . Generalized anxiety disorder   . History of COVID-19 08/2019  . Hypothyroidism 04/16/2019  . Internal hemorrhoids 08/21/2018  . Major depressive disorder   . Muscle fasciculation 12/22/2019  . Muscle weakness   . Obstructive sleep apnea    confirmed via sleep test; no current CPAP use  . Ringing in ear     Past Surgical History:  Procedure Laterality Date  . COLONOSCOPY    . URETEROSCOPY      There were no vitals filed for this visit.   Subjective Assessment - 07/13/20 0857    Subjective "Noise is now a real issue with me."    Currently in Pain? Yes    Pain Score 3     Pain Location Head    Pain Descriptors / Indicators Headache    Pain Type Chronic pain    Pain Onset Today    Multiple Pain Sites Yes    Pain Score 3    Pain Orientation --   "all over general muscluar/joint pain"   Pain Descriptors / Indicators Aching    Pain Type Chronic pain    Pain Onset More than a month ago    Pain Frequency Constant                 ADULT SLP TREATMENT - 07/13/20 0905      General Information   Behavior/Cognition Alert;Cooperative      Treatment Provided   Treatment provided  Cognitive-Linquistic      Cognitive-Linquistic Treatment   Treatment focused on Cognition;Patient/family/caregiver education    Skilled Treatment Pt reports "frustrated- very frustrated after COVID with what's happened to me." Pt also endorses a little bit of anger at his situation, specifically about trying to be healthy at 49 and having pain. Embarassed about his condition. Michael Warren tells SLP that he has difficulty at work with all of the noise and it is tolerable but auditorily is very difficult for him because does not see it changing. SLP suggested headphones or ear plugs but at this time pt stated it's not possible for pt. to buy headphones, and he does not want to wear ear plugs at work. SLP suggested [employer] or finding a pair from someone else. Pt appeared postitive about this option. SLP educated pt about a "signal" to people in his office that when he has headphones on do not bother him. Pt is keeping alarms for "everything", successfully, and using his phone calendar successfully as well. SLP talked with pt today about some of the positive change he has seen and he stated  he [place] walk [place] but was disappointed at his inability to keep up with his son. SLP suggested neuropsych for communicating his feelings and how to work through these if he feels like and/or family feels like/thinks they are keeping him down.      Assessment / Recommendations / Plan   Plan Continue with current plan of care      Progression Toward Goals   Progression toward goals Progressing toward goals            SLP Education - 07/13/20 1752    Education Details use headphones at work    [employer]) Educated Patient    Methods Explanation    Comprehension Verbalized understanding            SLP Short Term Goals - 07/13/20 0929      SLP SHORT TERM GOAL #1   Title pt will complete functional mod complex alternating attention tasks with modified independence in 3 sessions    Time 3    Period Weeks     Status On-going      SLP SHORT TERM GOAL #2   Title pt will demo functional linguistic processing (auditory and written intormation) in structured tasks with rare min-mod A in 3 sessions    Time 3    Period Weeks    Status On-going      SLP SHORT TERM GOAL #3   Title pt will demo appropriate skills with appointment management, completing to-do lists, and other more simple functional tasks involving problem solving and organization in 3 sessions    Time 3    Period Weeks    Status On-going            SLP Long Term Goals - 07/13/20 0930      SLP LONG TERM GOAL #1   Title pt will complete functional simple tasks using divided attention in 3 sessions    Time 7    Period Weeks   or 17 sessions,for all LTGs   Status On-going      SLP LONG TERM GOAL #2   Title pt will demo functional linguistic processing (auditory and written intormation) in work-like tasks in 3 sessions    Time 7    Period Weeks    Status On-going      SLP LONG TERM GOAL #3   Title pt will demo appropriate skills with financial management, scheduling, and other functional tasks involving problem solving and organization in 3 sessions    Time 7    Period Weeks    Status On-going            Plan - 07/13/20 1752    Clinical Impression Statement Michael Warren presents today with cognitive-linguistics following contracing [id number] in Februray 2021. Pt continues to voice frustration regarding his deficits. SLP continues to believe Michael Warren is having more difficulty with attention (and thus memory), organization/problem solving, and processing. Pt would beneift from skilled ST targeting attention, organization and problem solving in terms of linguistics. OT will address pt's memory strategies.3    Speech Therapy Frequency 2x / week    Duration 8 weeks   17 visits   Treatment/Interventions Environmental controls;Functional tasks;Compensatory techniques;Cueing hierarchy;SLP instruction and feedback;Internal/external  aids;Patient/family education;Cognitive reorganization    Potential to Achieve Goals Good           Patient will benefit from skilled therapeutic intervention in order to improve the following deficits and impairments:   Cognitive communication deficit    Problem List  Patient Active Problem List   Diagnosis Date Noted  . Obstructive sleep apnea   . Generalized anxiety disorder   . Major depressive disorder   . Weakness 12/22/2019  . Muscle fasciculation 12/22/2019  . Fatigue 10/30/2019  . History of COVID-19 08/2019  . Hypothyroidism 04/16/2019  . Internal hemorrhoids 08/21/2018  . Acute pericarditis   . Globus sensation   . Essential hypertension   . Empty sella syndrome   . Chest pain with low risk for cardiac etiology 12/22/2015    [facility] ,MS, CCC-SLP  07/13/2020, 5:54 PM  [facility] [address] [place], [place], [address] Phone: [phone]   Fax:  [phone]   Name: Michael Warren MRN: [id number] Date of Birth: 1971-02-19

## 2020-07-13 NOTE — Therapy (Signed)
[facility] [address] [place], [place], [address] Phone: [phone]   Fax:  [phone]  Occupational Therapy Treatment  Patient Details  Name: Michael Warren MRN: [id number] Date of Birth: 08-14-1970 Referring Provider (OT): Dr. Sharlene Dory   Encounter Date: 07/13/2020   OT End of Session - 07/13/20 1029    Visit Number 4    Number of Visits 17    Date for OT Re-Evaluation 09/06/20    Authorization Type UHC 2021:  60 visit limit combined OT, PT, ST    OT Start Time 0930    OT Stop Time 1015    OT Time Calculation (min) 45 min    Activity Tolerance Patient tolerated treatment well    Behavior During Therapy [facility] for tasks assessed/performed           Past Medical History:  Diagnosis Date  . Acute pericarditis   . Brain fog    stemming from COVID-19  . Empty sella syndrome    partial; on hormone replacement  . Esophageal ulcer   . Essential hypertension   . Fatigue 10/30/2019  . Generalized anxiety disorder   . History of COVID-19 08/2019  . Hypothyroidism 04/16/2019  . Internal hemorrhoids 08/21/2018  . Major depressive disorder   . Muscle fasciculation 12/22/2019  . Muscle weakness   . Obstructive sleep apnea    confirmed via sleep test; no current CPAP use  . Ringing in ear     Past Surgical History:  Procedure Laterality Date  . COLONOSCOPY    . URETEROSCOPY      There were no vitals filed for this visit.   Subjective Assessment - 07/13/20 0950    Subjective  Dizziness today b/t 5-6/10    Pertinent History Visual impairments after Covid-19 (08/2019) with dizziness, fatigue, brain fog.    PMH:  sleep apnea, Empty sella syndrome (CMS-HCC), Anxiety, Depression, Hypertension, Hypothyroid, Pericarditis, PVC (premature ventricular contraction)  and Tinnitus    Currently in Pain? Yes    Pain Score 4     Pain Location Generalized    Pain Type Chronic pain    Pain Onset More than a month ago    Pain  Frequency Constant    Aggravating Factors  stress, over exertion    Pain Relieving Factors ?            Discussed ways to gradually increase tolerance and make activity more challenging but not increasing challenge in more than one variable at a time. Reviewed cognitive and visual tips.   Pt worked on [employer] (bubbles and clouds) for visual scanning of a moving target, saccades, alternating attention with increased difficulty as task sped up. Pt walking w/ head turns while calling out number and suit of card - pt appeared to stumble more when looking to Rt especially upper Rt.                       OT Short Term Goals - 07/13/20 1030      OT SHORT TERM GOAL #1   Title Pt will verbalize understanding of visual compensation strategies for ADLs and IADLs.--check STGs12/30/21    Time 4    Period Weeks    Status New      OT SHORT TERM GOAL #2   Title Pt will verbalize understanding of memory and cognitive compensation stategies for ADLs/IADLs including work tasks.    Time 4    Period Weeks  Status On-going      OT SHORT TERM GOAL #3   Title Pt will be able to perform seated functional visual scanning tasks such as reading, working on computer, scanning for at least without rest break or significantly decreasing speed and with dizziness/nausea rated at 3/10 or less.    Time 4    Period Weeks    Status On-going      OT SHORT TERM GOAL #4   Title Pt will be able to perform dynamic visual scanning/environmental scanning in busy environment with at least 95% accuracy and dizziness/nausea rated at 3/10 or less for incr ease with work tasks and community activities/shopping.    Time 4    Period Weeks    Status On-going   dizziness continues to be b/t 5-6/10     OT SHORT TERM GOAL #5   Title Pt will perform alternating attention for IADLs(cooking) and simulated work tasks with at least 75% accuracy.    Time 4    Period Weeks    Status New              OT Long Term Goals - 06/08/20 1849      OT LONG TERM GOAL #1   Title Pt will be independent with home program for vision, functional cognition, and IADL participation.--check LTGs 09/08/20    Time 12    Period Weeks    Status New      OT LONG TERM GOAL #2   Title Pt will be able to perform environmental scanning with simple divided attention with at least 90% accuracy.    Time 12    Period Weeks    Status New      OT LONG TERM GOAL #3   Title Pt will perform alternating attention for IADLs(cooking) and simulated work tasks with at least 90% accuracy.    Time 12    Period Weeks    Status New      OT LONG TERM GOAL #4   Title Pt will perform visually demanding tasks (reading, scanning, environmental scanning, computer work) for at least 25 min without rest and dizziness/nausea less than or equal to 3/10.    Time 12    Period Weeks    Status New                 Plan - 07/13/20 1039    Clinical Impression Statement Pt with increased LOB w/ head turns when walking today especially when looking to Rt and upper Rt.    OT Occupational Profile and History Detailed Assessment- Review of Records and additional review of physical, cognitive, psychosocial history related to current functional performance    Occupational performance deficits (Please refer to evaluation for details): ADL's;IADL's;Leisure;Social Participation;Work    Games developer / Function / Physical Skills ADL;Vision;IADL    Cognitive Skills Memory;Attention    Rehab Potential Good    Clinical Decision Making Several treatment options, min-mod task modification necessary    Comorbidities Affecting Occupational Performance: May have comorbidities impacting occupational performance    Modification or Assistance to Complete Evaluation  Min-Moderate modification of tasks or assist with assess necessary to complete eval    OT Frequency Other (comment)   See below   OT Duration --   2x/week for 4 weeks followed by  1x/week for 8 weeks, +eval = 17 visits OT   OT Treatment/Interventions Self-care/ADL training;Aquatic Therapy;Coping strategies training;Therapeutic activities;Cognitive remediation/compensation;Visual/perceptual remediation/compensation;Patient/family education;Therapeutic exercise    Plan continue visual scanning, alternating attention  Consulted and Agree with Plan of Care Patient           Patient will benefit from skilled therapeutic intervention in order to improve the following deficits and impairments:   Body Structure / Function / Physical Skills: ADL,Vision,IADL Cognitive Skills: Memory,Attention     Visit Diagnosis: Visuospatial deficit  Attention and concentration deficit  Unsteadiness on feet  Frontal lobe and executive function deficit    Problem List Patient Active Problem List   Diagnosis Date Noted  . Obstructive sleep apnea   . Generalized anxiety disorder   . Major depressive disorder   . Weakness 12/22/2019  . Muscle fasciculation 12/22/2019  . Fatigue 10/30/2019  . History of COVID-19 08/2019  . Hypothyroidism 04/16/2019  . Internal hemorrhoids 08/21/2018  . Acute pericarditis   . Globus sensation   . Essential hypertension   . Empty sella syndrome   . Chest pain with low risk for cardiac etiology 12/22/2015    Kelli Churn, OTR/L 07/13/2020, 10:40 AM  [facility] [address] [place], [place], [address] Phone: [phone]   Fax:  [phone]  Name: Michael Warren MRN: [id number] Date of Birth: 10-Aug-1970

## 2020-07-13 NOTE — Patient Instructions (Signed)
   You may want to try headphones to reduce noise at work. This can also be a "signal" to people to come back later and not bother you.

## 2020-07-20 ENCOUNTER — Ambulatory Visit: Payer: 59 | Admitting: Physical Therapy

## 2020-07-20 ENCOUNTER — Ambulatory Visit: Payer: 59

## 2020-07-20 ENCOUNTER — Encounter: Payer: Self-pay | Admitting: Physical Therapy

## 2020-07-20 ENCOUNTER — Ambulatory Visit: Payer: 59 | Admitting: Occupational Therapy

## 2020-07-20 ENCOUNTER — Other Ambulatory Visit: Payer: Self-pay

## 2020-07-20 DIAGNOSIS — R41842 Visuospatial deficit: Secondary | ICD-10-CM | POA: Diagnosis not present

## 2020-07-20 DIAGNOSIS — R2681 Unsteadiness on feet: Secondary | ICD-10-CM

## 2020-07-20 DIAGNOSIS — R41844 Frontal lobe and executive function deficit: Secondary | ICD-10-CM

## 2020-07-20 DIAGNOSIS — R42 Dizziness and giddiness: Secondary | ICD-10-CM

## 2020-07-20 DIAGNOSIS — R262 Difficulty in walking, not elsewhere classified: Secondary | ICD-10-CM

## 2020-07-20 DIAGNOSIS — R41840 Attention and concentration deficit: Secondary | ICD-10-CM

## 2020-07-20 NOTE — Therapy (Signed)
[facility] [address] [place], [place], [address] Phone: [phone]   Fax:  [phone]  Occupational Therapy Treatment  Patient Details  Name: Michael Warren MRN: [id number] Date of Birth: [birth year] Referring Provider (OT): Dr. Hope Pigeon   Encounter Date: 07/20/2020   OT End of Session - 07/20/20 0935    Visit Number 5    Number of Visits 17    Date for OT Re-Evaluation 09/06/20    Authorization Type UHC 2021:  60 visit limit combined OT, PT, ST    OT Start Time 0933    OT Stop Time 1015    OT Time Calculation (min) 42 min    Activity Tolerance Patient tolerated treatment well    Behavior During Therapy [facility] for tasks assessed/performed           Past Medical History:  Diagnosis Date  . Acute pericarditis   . Brain fog    stemming from COVID-19  . Empty sella syndrome    partial; on hormone replacement  . Esophageal ulcer   . Essential hypertension   . Fatigue 10/30/2019  . Generalized anxiety disorder   . History of COVID-19 08/2019  . Hypothyroidism 04/16/2019  . Internal hemorrhoids 08/21/2018  . Major depressive disorder   . Muscle fasciculation 12/22/2019  . Muscle weakness   . Obstructive sleep apnea    confirmed via sleep test; no current CPAP use  . Ringing in ear     Past Surgical History:  Procedure Laterality Date  . COLONOSCOPY    . URETEROSCOPY      There were no vitals filed for this visit.   Subjective Assessment - 07/20/20 0934    Subjective  Dizziness today 6/10.  Walking, driving (only drives 25DGU at a time), movement    Pertinent History Visual impairments after Covid-19 (08/2019) with dizziness, fatigue, brain fog.    PMH:  sleep apnea, Empty sella syndrome (CMS-HCC), Anxiety, Depression, Hypertension, Hypothyroid, Pericarditis, PVC (premature ventricular contraction)  and Tinnitus    Patient Stated Goals to get better    Currently in Pain? Yes    Pain Score 5     Pain  Location Back    Pain Orientation Mid    Pain Descriptors / Indicators Aching;Discomfort;Dull    Pain Type Chronic pain    Pain Onset More than a month ago    Aggravating Factors  unknown    Pain Relieving Factors unknown           Pt reports that he started wearing a hat to work to keep light out of eyes and that seems to help.    Trial of reading guides (for attention and light sensitivity) with 100% accuracy with incr time.  Pt reports that using plain reading guide (paper, no tint) seemed to be best.  Recommended pt use 2 pieces of colored paper as line guides to block out extra visual info.  Also trial of glare shields (pt preferred amber or rose color), but unsure if they will help.  Recommended pt first try sunglasses due to light sensitivity, dizziness, and headaches and consider glare shields or blue light blockers.  Constant therapy Alternating Symbol Match for visual scanning and alternating attention with 95% accuracy and average 52.51sec response time.     OT Education - 07/20/20 1042    Education Details Visual compensation strategies--see pt instructions; Neuro Optometrist Info as possible Estate manager/land agent) Educated Patient    Methods Explanation;Handout  Comprehension Verbalized understanding            OT Short Term Goals - 07/13/20 1030      OT SHORT TERM GOAL #1   Title Pt will verbalize understanding of visual compensation strategies for ADLs and IADLs.--check STGs12/30/21    Time 4    Period Weeks    Status New      OT SHORT TERM GOAL #2   Title Pt will verbalize understanding of memory and cognitive compensation stategies for ADLs/IADLs including work tasks.    Time 4    Period Weeks    Status On-going      OT SHORT TERM GOAL #3   Title Pt will be able to perform seated functional visual scanning tasks such as reading, working on computer, scanning for at least without rest break or significantly decreasing speed and with  dizziness/nausea rated at 3/10 or less.    Time 4    Period Weeks    Status On-going      OT SHORT TERM GOAL #4   Title Pt will be able to perform dynamic visual scanning/environmental scanning in busy environment with at least 95% accuracy and dizziness/nausea rated at 3/10 or less for incr ease with work tasks and community activities/shopping.    Time 4    Period Weeks    Status On-going   dizziness continues to be b/t 5-6/10     OT SHORT TERM GOAL #5   Title Pt will perform alternating attention for IADLs(cooking) and simulated work tasks with at least 75% accuracy.    Time 4    Period Weeks    Status New             OT Long Term Goals - 06/08/20 1849      OT LONG TERM GOAL #1   Title Pt will be independent with home program for vision, functional cognition, and IADL participation.--check LTGs 09/08/20    Time 12    Period Weeks    Status New      OT LONG TERM GOAL #2   Title Pt will be able to perform environmental scanning with simple divided attention with at least 90% accuracy.    Time 12    Period Weeks    Status New      OT LONG TERM GOAL #3   Title Pt will perform alternating attention for IADLs(cooking) and simulated work tasks with at least 90% accuracy.    Time 12    Period Weeks    Status New      OT LONG TERM GOAL #4   Title Pt will perform visually demanding tasks (reading, scanning, environmental scanning, computer work) for at least 25 min without rest and dizziness/nausea less than or equal to 3/10.    Time 12    Period Weeks    Status New                 Plan - 07/20/20 0936    Clinical Impression Statement Pt with increased LOB w/ head turns when walking today especially when looking to Rt and upper Rt.    OT Occupational Profile and History Detailed Assessment- Review of Records and additional review of physical, cognitive, psychosocial history related to current functional performance    Occupational performance deficits (Please refer  to evaluation for details): ADL's;IADL's;Leisure;Social Participation;Work    Games developer / Function / Physical Skills ADL;Vision;IADL    Cognitive Skills Memory;Attention    Rehab Potential Good  Clinical Decision Making Several treatment options, min-mod task modification necessary    Comorbidities Affecting Occupational Performance: May have comorbidities impacting occupational performance    Modification or Assistance to Complete Evaluation  Min-Moderate modification of tasks or assist with assess necessary to complete eval    OT Frequency Other (comment)   See below   OT Duration --   2x/week for 4 weeks followed by 1x/week for 8 weeks, +eval = 17 visits OT   OT Treatment/Interventions Self-care/ADL training;Aquatic Therapy;Coping strategies training;Therapeutic activities;Cognitive remediation/compensation;Visual/perceptual remediation/compensation;Patient/family education;Therapeutic exercise    Plan continue visual scanning, alternating attention    Consulted and Agree with Plan of Care Patient           Patient will benefit from skilled therapeutic intervention in order to improve the following deficits and impairments:   Body Structure / Function / Physical Skills: ADL,Vision,IADL Cognitive Skills: Memory,Attention     Visit Diagnosis: Visuospatial deficit  Attention and concentration deficit  Unsteadiness on feet  Frontal lobe and executive function deficit    Problem List Patient Active Problem List   Diagnosis Date Noted  . Obstructive sleep apnea   . Generalized anxiety disorder   . Major depressive disorder   . Weakness 12/22/2019  . Muscle fasciculation 12/22/2019  . Fatigue 10/30/2019  . History of COVID-19 08/2019  . Hypothyroidism 04/16/2019  . Internal hemorrhoids 08/21/2018  . Acute pericarditis   . Globus sensation   . Essential hypertension   . Empty sella syndrome   . Chest pain with low risk for cardiac etiology 12/22/2015     [facility] 07/20/2020, 10:51 AM  [facility] [address] [place], [place], [address] Phone: [phone]   Fax:  [phone]  Name: Michael Warren MRN: [id number] Date of Birth: [birth year]   Willa Frater, OTR/L [facility] [address] [place], [place]  [address] [phone] phone [phone] 07/20/20 10:51 AM

## 2020-07-20 NOTE — Patient Instructions (Addendum)
   Visual compensation strategies  1. Use an organized scanning pattern. It's usually easier to scan from top to bottom, and left to right (like you are reading)  2. Double check yourself  3. Use a line guide (like a blank piece of colored paper) or your finger when reading (or use 1 sheet above and 1 below line)  4.  Reduce clutter and organize workspaces.    5.  Try blue light glasses, or glare shields (rose or amber color)  6.  Look at "accessibility features" on computer. Try changing font sizes/styles, contrast, color schemes.   7.  Try reading guide computer app.   8.  Increase/decrease contrast sensitivity or adjust lighting in room or on devices.  Try contrasting color under paper.

## 2020-07-21 NOTE — Therapy (Signed)
[facility] [address] [place], [place], [address] Phone: [phone]   Fax:  [phone]  Physical Therapy Treatment  Patient Details  Name: Michael Warren MRN: [id number] Date of Birth: January 10, 1971 Referring Provider (PT): Sharlene Dory, MD   Encounter Date: 07/20/2020   PT End of Session - 07/21/20 1905    Visit Number 8    Number of Visits 15    Date for PT Re-Evaluation 09/23/20    Authorization Type UHC    PT Start Time 0805    PT Stop Time 0845    PT Time Calculation (min) 40 min    Activity Tolerance Other (comment)   limited by dizziness and c/o nausea          Past Medical History:  Diagnosis Date  . Acute pericarditis   . Brain fog    stemming from COVID-19  . Empty sella syndrome    partial; on hormone replacement  . Esophageal ulcer   . Essential hypertension   . Fatigue 10/30/2019  . Generalized anxiety disorder   . History of COVID-19 08/2019  . Hypothyroidism 04/16/2019  . Internal hemorrhoids 08/21/2018  . Major depressive disorder   . Muscle fasciculation 12/22/2019  . Muscle weakness   . Obstructive sleep apnea    confirmed via sleep test; no current CPAP use  . Ringing in ear     Past Surgical History:  Procedure Laterality Date  . COLONOSCOPY    . URETEROSCOPY      There were no vitals filed for this visit.        NeuroRe-ed:    Pt performed amb. Tossing/catching ball straight up and then side to side -- 40' x 2 reps each Amb. Tracking ball clockwise and counterclockwise 40'x 1 each direction  x1 viewing with target on patterned background - 30 secs horizontal and 30 secs vertical   Jumping on mini trampoline - 5 reps with EO and then 5 reps with EC - 3 sets   Amb. 70'' with horizontal head turns with SBA - no major LOB            Balance Exercises - 07/21/20 0001      Balance Exercises: Standing   Gait with Head Turns Forward;2 reps   40'   Marching  Foam/compliant surface;Static;10 reps   on 1 pillow in corner   Other Standing Exercises pt performed standing on blue mat - diagonal reaching down/up 5 reps to each side; figure 8 with ball between knees  3 reps with 180 degree turns; then with 360 degree turns 2 reps               PT Short Term Goals - 07/21/20 1908      PT SHORT TERM GOAL #1   Title Patient will be able to ambulate with horizontal/vertical head turns x 100 ft with reports of </= 5/10 dizziness with completion (ALL STGs: 07/23/20)    Baseline 7/10 dizziness    Time 4    Period Weeks    Status New    Target Date 07/23/20             PT Long Term Goals - 07/21/20 1908      PT LONG TERM GOAL #1   Title Pt will be independent with performing HEP for endurance and balance  (ALL LTG due by 07/02/20)    Baseline reports independence with HEP, completing daily    Time 6    Period  Weeks    Status Achieved      PT LONG TERM GOAL #2   Title Pt will have improved FGA to >/= 28/30    Baseline 24/30 on eval (05/21/20); 25/30    Time 6    Period Weeks    Status Not Met      PT LONG TERM GOAL #3   Title Pt will report symptoms </=1/5 when checking motion sensitivities    Baseline continue to have moderate dizziness (3/5) with positional changes,, some have improved to 1/5    Time 6    Period Weeks    Status Partially Met      PT LONG TERM GOAL #4   Title Pt will have improved DHI to </=60/100 to decrease to moderate severity    Baseline 86/100 currently rated as severe; 92/100 on 12/17    Time 6    Period Weeks    Status Not Met      PT LONG TERM GOAL #5   Title Pt will have improved FOTO score to >/= 68%    Baseline 45% score at eval (05/21/20); 45% on 12/17    Time 6    Period Weeks    Status Not Met      PT LONG TERM GOAL #6   Title Pt will increase distance on 6 minute walk by 200' and will report </= 4/10 dizziness after performing    Baseline 831', 7-8/10 dizziness    Time 6    Period Weeks     Status On-going                 Plan - 07/21/20 1906    Clinical Impression Statement Pt continues to have motion sensitivity with vestibular activities ; reported increased nausea and dizziness with jumping on mini trampoline near end of session.  Pt stated he would not be able to attempt walking at an increased speed due to dizziness provoked in session.  Cont with POC.    Personal Factors and Comorbidities Age    Examination-Activity Limitations Locomotion Level;Squat;Stairs;Lift;Bend    Examination-Participation Restrictions Interpersonal Relationship;Occupation;Cleaning;Yard Work;Community Activity    Stability/Clinical Decision Making Evolving/Moderate complexity    Rehab Potential Good    PT Frequency 1x / week    PT Duration 6 weeks    PT Treatment/Interventions ADLs/Self Care Home Management;Electrical Stimulation;Moist Heat;Iontophoresis 27m/ml Dexamethasone;Cryotherapy;DME Instruction;Gait training;Stair training;Functional mobility training;Therapeutic activities;Therapeutic exercise;Balance training;Neuromuscular re-education;Patient/family education;Manual techniques;Energy conservation;Taping;Vestibular    PT Next Visit Plan Cont with balance/vestibular exercises - No true vertigo; feels like he is rocking on a boat -constant.   Continue to progress x1 viewing, habituation to walking faster speeds.    Consulted and Agree with Plan of Care Patient           Patient will benefit from skilled therapeutic intervention in order to improve the following deficits and impairments:  Decreased balance,Decreased endurance,Decreased mobility,Difficulty walking,Dizziness,Decreased activity tolerance  Visit Diagnosis: Unsteadiness on feet  Dizziness and giddiness  Difficulty in walking, not elsewhere classified     Problem List Patient Active Problem List   Diagnosis Date Noted  . Obstructive sleep apnea   . Generalized anxiety disorder   . Major depressive disorder    . Weakness 12/22/2019  . Muscle fasciculation 12/22/2019  . Fatigue 10/30/2019  . History of COVID-19 08/2019  . Hypothyroidism 04/16/2019  . Internal hemorrhoids 08/21/2018  . Acute pericarditis   . Globus sensation   . Essential hypertension   . Empty sella syndrome   .  Chest pain with low risk for cardiac etiology 12/22/2015    Alda Lea, PT 07/21/2020, 7:10 PM  [facility] [address] [place], [place], [address] Phone: [phone]   Fax:  [phone]  Name: Michael Warren MRN: [id number] Date of Birth: 1971/05/15

## 2020-07-27 ENCOUNTER — Ambulatory Visit: Payer: 59 | Admitting: Physical Therapy

## 2020-07-27 ENCOUNTER — Ambulatory Visit: Payer: 59

## 2020-07-27 ENCOUNTER — Ambulatory Visit: Payer: 59 | Admitting: Occupational Therapy

## 2020-08-03 ENCOUNTER — Ambulatory Visit: Payer: 59 | Admitting: Occupational Therapy

## 2020-08-03 ENCOUNTER — Ambulatory Visit: Payer: 59 | Admitting: Physical Therapy

## 2020-08-03 ENCOUNTER — Other Ambulatory Visit: Payer: Self-pay

## 2020-08-03 ENCOUNTER — Ambulatory Visit: Payer: 59

## 2020-08-03 DIAGNOSIS — R41840 Attention and concentration deficit: Secondary | ICD-10-CM

## 2020-08-03 DIAGNOSIS — R41842 Visuospatial deficit: Secondary | ICD-10-CM | POA: Diagnosis not present

## 2020-08-03 DIAGNOSIS — R262 Difficulty in walking, not elsewhere classified: Secondary | ICD-10-CM

## 2020-08-03 DIAGNOSIS — R2681 Unsteadiness on feet: Secondary | ICD-10-CM

## 2020-08-03 DIAGNOSIS — R41844 Frontal lobe and executive function deficit: Secondary | ICD-10-CM

## 2020-08-03 DIAGNOSIS — R41841 Cognitive communication deficit: Secondary | ICD-10-CM

## 2020-08-03 DIAGNOSIS — R42 Dizziness and giddiness: Secondary | ICD-10-CM

## 2020-08-03 NOTE — Patient Instructions (Addendum)
  Contact your PCP or [name] at the post-COVID care clinic to inquire about another med for your decreased attention due to noise.

## 2020-08-03 NOTE — Therapy (Signed)
[facility] [address] [place], [place], [address] Phone: [phone]   Fax:  [phone]  Occupational Therapy Treatment  Patient Details  Name: Michael Warren MRN: [id number] Date of Birth: 27-Mar-1971 Referring Provider (OT): Dr. Hope Pigeon   Encounter Date: 08/03/2020   OT End of Session - 08/03/20 0949    Visit Number 6    Number of Visits 17    Date for OT Re-Evaluation 09/06/20    Authorization Type [employer] 2021:  60 visit limit combined OT, PT, ST    Authorization Time Period wk 6/12    OT Start Time 0939    OT Stop Time 1020    OT Time Calculation (min) 41 min    Activity Tolerance Patient tolerated treatment well    Behavior During Therapy [facility] for tasks assessed/performed           Past Medical History:  Diagnosis Date  . Acute pericarditis   . Brain fog    stemming from COVID-19  . Empty sella syndrome    partial; on hormone replacement  . Esophageal ulcer   . Essential hypertension   . Fatigue 10/30/2019  . Generalized anxiety disorder   . History of COVID-19 08/2019  . Hypothyroidism 04/16/2019  . Internal hemorrhoids 08/21/2018  . Major depressive disorder   . Muscle fasciculation 12/22/2019  . Muscle weakness   . Obstructive sleep apnea    confirmed via sleep test; no current CPAP use  . Ringing in ear     Past Surgical History:  Procedure Laterality Date  . COLONOSCOPY    . URETEROSCOPY      There were no vitals filed for this visit.   Subjective Assessment - 08/03/20 0943    Subjective  better than I was when I started, but stumbling, brain fog is still an issue    Pertinent History Visual impairments after Covid-19 (08/2019) with dizziness, fatigue, brain fog.    PMH:  sleep apnea, Empty sella syndrome (CMS-HCC), Anxiety, Depression, Hypertension, Hypothyroid, Pericarditis, PVC (premature ventricular contraction)  and Tinnitus    Patient Stated Goals to get better    Currently in  Pain? Yes    Pain Score 4     Pain Location Head    Pain Descriptors / Indicators Headache    Pain Type Chronic pain    Pain Onset More than a month ago    Pain Frequency Constant    Aggravating Factors  concentrating    Pain Relieving Factors unknown    Multiple Pain Sites Yes    Pain Score 4    Pain Location Head    Pain Orientation Left    Pain Descriptors / Indicators Cramping    Pain Type Chronic pain    Pain Onset More than a month ago    Pain Frequency Constant    Aggravating Factors  driving, use    Pain Relieving Factors rest           Constant therapy Map Activity level 1 with 1 error.  Alternating Symbol Match for divided attention and visual scanning/perception with  100% accuracy and 105.48 sec average response time, level 8.   (dizziness rated at 5/10)  Environmental scanning in dynamic mod busy environment with 93% accuracy (missed 1 item, found upon 2nd pass).  Rated dizziness/"seasickness" 6/10 (baseline 5/10)         OT Education - 08/03/20 1140    Education Details Reviewed visual and cognitive compensation Strategies (including lists, organizing,  reduce clutter, incr contrast of work space, screen adjustments, AE, line guides, verbalize intentions, wearing sunglasses, alarms, use of headphones with white noise, etc)    Person(s) Educated Patient    Methods Explanation    Comprehension Verbalized understanding            OT Short Term Goals - 08/03/20 1018      OT SHORT TERM GOAL #1   Title Pt will verbalize understanding of visual compensation strategies for ADLs and IADLs.--check STGs12/30/21    Time 4    Period Weeks    Status Achieved      OT SHORT TERM GOAL #2   Title Pt will verbalize understanding of memory and cognitive compensation stategies for ADLs/IADLs including work tasks.    Time 4    Period Weeks    Status Achieved      OT SHORT TERM GOAL #3   Title Pt will be able to perform seated functional visual scanning tasks such as  reading, working on computer, scanning for at least without rest break or significantly decreasing speed and with dizziness/nausea rated at 3/10 or less.    Time 4    Period Weeks    Status On-going   08/03/20:  no rest or significant decr in speed, maintains 5/10 dizziness/nausea (pt reports that 5/10 is baseline)     OT SHORT TERM GOAL #4   Title Pt will be able to perform dynamic visual scanning/environmental scanning in busy environment with at least 95% accuracy and dizziness/nausea rated at 3/10 or less for incr ease with work tasks and community activities/shopping.    Time 4    Period Weeks    Status On-going   dizziness continues to be b/t 5-6/10.  08/03/20:  continues to rate dizziness/nausea at 6/10, 93% accuracy     OT SHORT TERM GOAL #5   Title Pt will perform alternating attention for IADLs(cooking) and simulated work tasks with at least 75% accuracy.    Time 4    Period Weeks    Status On-going             OT Long Term Goals - 06/08/20 1849      OT LONG TERM GOAL #1   Title Pt will be independent with home program for vision, functional cognition, and IADL participation.--check LTGs 09/08/20    Time 12    Period Weeks    Status New      OT LONG TERM GOAL #2   Title Pt will be able to perform environmental scanning with simple divided attention with at least 90% accuracy.    Time 12    Period Weeks    Status New      OT LONG TERM GOAL #3   Title Pt will perform alternating attention for IADLs(cooking) and simulated work tasks with at least 90% accuracy.    Time 12    Period Weeks    Status New      OT LONG TERM GOAL #4   Title Pt will perform visually demanding tasks (reading, scanning, environmental scanning, computer work) for at least 25 min without rest and dizziness/nausea less than or equal to 3/10.    Time 12    Period Weeks    Status New                 Plan - 08/03/20 0950    Clinical Impression Statement Pt demo incr tolerance for  visual activities in sitting and only mild incr with  environmental scanning today. Pt slowly progressing towards goals    OT Occupational Profile and History Detailed Assessment- Review of Records and additional review of physical, cognitive, psychosocial history related to current functional performance    Occupational performance deficits (Please refer to evaluation for details): ADL's;IADL's;Leisure;Social Participation;Work    Games developer / Function / Physical Skills ADL;Vision;IADL    Cognitive Skills Memory;Attention    Rehab Potential Good    Clinical Decision Making Several treatment options, min-mod task modification necessary    Comorbidities Affecting Occupational Performance: May have comorbidities impacting occupational performance    Modification or Assistance to Complete Evaluation  Min-Moderate modification of tasks or assist with assess necessary to complete eval    OT Frequency Other (comment)   See below   OT Duration --   2x/week for 4 weeks followed by 1x/week for 8 weeks, +eval = 17 visits OT   OT Treatment/Interventions Self-care/ADL training;Aquatic Therapy;Coping strategies training;Therapeutic activities;Cognitive remediation/compensation;Visual/perceptual remediation/compensation;Patient/family education;Therapeutic exercise    Plan check remaining STGs; visual scanning with head turns (seated), alternating attention/divided attention    Consulted and Agree with Plan of Care Patient           Patient will benefit from skilled therapeutic intervention in order to improve the following deficits and impairments:   Body Structure / Function / Physical Skills: ADL,Vision,IADL Cognitive Skills: Memory,Attention     Visit Diagnosis: Visuospatial deficit  Attention and concentration deficit  Frontal lobe and executive function deficit  Unsteadiness on feet    Problem List Patient Active Problem List   Diagnosis Date Noted  . Obstructive sleep apnea   .  Generalized anxiety disorder   . Major depressive disorder   . Weakness 12/22/2019  . Muscle fasciculation 12/22/2019  . Fatigue 10/30/2019  . History of COVID-19 08/2019  . Hypothyroidism 04/16/2019  . Internal hemorrhoids 08/21/2018  . Acute pericarditis   . Globus sensation   . Essential hypertension   . Empty sella syndrome   . Chest pain with low risk for cardiac etiology 12/22/2015    [facility] 08/03/2020, 11:49 AM  [facility] [address] [place], [place], [address] Phone: [phone]   Fax:  [phone]  Name: Michael Warren MRN: [id number] Date of Birth: [birth year]   Willa Frater, OTR/L [facility] [address] [place], [place]  [address] [phone] phone [phone] 08/03/20 11:49 AM

## 2020-08-03 NOTE — Therapy (Signed)
[facility] [address] [place], [place], [address] Phone: [phone]   Fax:  [phone]  Speech Language Pathology Treatment  Patient Details  Name: Michael Warren MRN: [id number] Date of Birth: 1971-06-24 Referring Provider (SLP): Michael Pigeon, MD   Encounter Date: 08/03/2020   End of Session - 08/03/20 1700    Visit Number 4    Number of Visits 16    Date for SLP Re-Evaluation 09/06/20    SLP Start Time 0851    SLP Stop Time  0931    SLP Time Calculation (min) 40 min    Activity Tolerance Patient tolerated treatment well           Past Medical History:  Diagnosis Date  . Acute pericarditis   . Brain fog    stemming from COVID-19  . Empty sella syndrome    partial; on hormone replacement  . Esophageal ulcer   . Essential hypertension   . Fatigue 10/30/2019  . Generalized anxiety disorder   . History of COVID-19 08/2019  . Hypothyroidism 04/16/2019  . Internal hemorrhoids 08/21/2018  . Major depressive disorder   . Muscle fasciculation 12/22/2019  . Muscle weakness   . Obstructive sleep apnea    confirmed via sleep test; no current CPAP use  . Ringing in ear     Past Surgical History:  Procedure Laterality Date  . COLONOSCOPY    . URETEROSCOPY      There were no vitals filed for this visit.   Subjective Assessment - 08/03/20 0901    Subjective Pt is writing a schedule for the day and ticks off the list as he progresses through the day.    Currently in Pain? Yes    Pain Score 3     Pain Location Head    Pain Orientation Anterior    Pain Descriptors / Indicators Headache    Pain Type Chronic pain    Pain Onset More than a month ago    Pain Frequency Constant    Pain Score 4    Pain Location Hand    Pain Orientation Left    Pain Descriptors / Indicators Aching    Pain Type Chronic pain    Pain Onset More than a month ago    Pain Frequency Constant                 ADULT SLP  TREATMENT - 08/03/20 0905      General Information   Behavior/Cognition Alert;Cooperative   flat affect     Treatment Provided   Treatment provided Cognitive-Linquistic      Cognitive-Linquistic Treatment   Treatment focused on Cognition;Patient/family/caregiver education    Skilled Treatment [name] is currently on [name] - but still having difficulty focusing with noise. Pt cont to c/o noise at work affecting his attention. SLP reviewed environmental strategies to assist his attention (breaks, wearing headphones, minimizing ambient noise in his office from the rest of the office, etc. He states he has to take a break approx every 10 minutes when the office is noisy. Pt will purchase headphones (noise cancelling). SLP worked with pt today with working on [name] work while also desensitizing his audio pathway to ambient noise by using [employer] on radio and SLP generating  keyboard noise. Pt got two problems done in each 5-minute segment. Pt voiced disappointed in his performance. - "I didn't get very far." SLP encouraged pt to contact his PCP and/or NP at post-Covid care clinic to inquire about  a different med for attention to address der'd attention due to noise.      Assessment / Recommendations / Plan   Plan Continue with current plan of care      Progression Toward Goals   Progression toward goals Progressing toward goals            SLP Education - 08/03/20 1700    Education Details Michael Warren name    Person(s) Educated Patient    Methods Explanation    Comprehension Verbalized understanding            SLP Short Term Goals - 08/03/20 1702      SLP SHORT TERM GOAL #1   Title pt will complete functional mod complex alternating attention tasks with modified independence in 3 sessions    Baseline 08-03-20    Time 2    Period Weeks    Status On-going      SLP SHORT TERM GOAL #2   Title pt will demo functional linguistic processing (auditory and written intormation) in  structured tasks with rare min-mod A in 3 sessions    Time 2    Period Weeks    Status On-going      SLP SHORT TERM GOAL #3   Title pt will demo appropriate skills with appointment management, completing to-do lists, and other more simple functional tasks involving problem solving and organization in 3 sessions    Baseline 08-03-20    Time 2    Period Weeks    Status On-going            SLP Long Term Goals - 08/03/20 1703      SLP LONG TERM GOAL #1   Title pt will complete functional simple tasks using divided attention in 3 sessions    Time 6    Period Weeks   or 17 sessions,for all LTGs   Status On-going      SLP LONG TERM GOAL #2   Title pt will demo functional linguistic processing (auditory and written intormation) in work-like tasks in 3 sessions    Time 6    Period Weeks    Status On-going      SLP LONG TERM GOAL #3   Title pt will demo appropriate skills with financial management, scheduling, and other functional tasks involving problem solving and organization in 3 sessions    Time 6    Period Weeks    Status On-going            Plan - 08/03/20 1701    Clinical Impression Statement [name] presents today with decr'd cognitive-linguistic skills following contracing COVID-19 in Februray 2021. Pt continues to voice frustration regarding his deficits, especially due to noise distractions. SLP continues to believe Unnamed is having more difficulty with attention (and thus memory), organization/problem solving, and processing. See "skilled intervention" for more details. Pt would beneift from skilled ST targeting attention, organization and problem solving in terms of linguistics. OT will address pt's memory strategies.3    Speech Therapy Frequency 2x / week    Duration 8 weeks   17 visits   Treatment/Interventions Environmental controls;Functional tasks;Compensatory techniques;Cueing hierarchy;SLP instruction and feedback;Internal/external aids;Patient/family  education;Cognitive reorganization    Potential to Achieve Goals Good           Patient will benefit from skilled therapeutic intervention in order to improve the following deficits and impairments:   Cognitive communication deficit    Problem List Patient Active Problem List   Diagnosis Date Noted  .  Obstructive sleep apnea   . Generalized anxiety disorder   . Major depressive disorder   . Weakness 12/22/2019  . Muscle fasciculation 12/22/2019  . Fatigue 10/30/2019  . History of COVID-19 08/2019  . Hypothyroidism 04/16/2019  . Internal hemorrhoids 08/21/2018  . Acute pericarditis   . Globus sensation   . Essential hypertension   . Empty sella syndrome   . Chest pain with low risk for cardiac etiology 12/22/2015    [facility] ,MS, CCC-SLP  08/03/2020, 5:04 PM  [facility] [address] [place], [place], [address] Phone: [phone]   Fax:  [phone]   Name: Michael Warren MRN: [id number] Date of Birth: [birth year]

## 2020-08-04 NOTE — Therapy (Signed)
[facility] [address] [place], [place], [address] Phone: [phone]   Fax:  [phone]  Physical Therapy Treatment  Patient Details  Name: Michael Warren MRN: [id number] Date of Birth: 1970/07/18 Referring Provider (PT): Sharlene Dory, MD   Encounter Date: 08/03/2020   PT End of Session - 08/03/20 1023    Visit Number 9    Number of Visits 15    Date for PT Re-Evaluation 09/23/20    Authorization Type U[employer]    PT Start Time 3662   pt arrived late from OT session scheduled prior to PT appt   PT Stop Time 1103    PT Time Calculation (min) 40 min    Activity Tolerance Patient tolerated treatment well    Behavior During Therapy [facility] for tasks assessed/performed           Past Medical History:  Diagnosis Date  . Acute pericarditis   . Brain fog    stemming from COVID-19  . Empty sella syndrome    partial; on hormone replacement  . Esophageal ulcer   . Essential hypertension   . Fatigue 10/30/2019  . Generalized anxiety disorder   . History of COVID-19 08/2019  . Hypothyroidism 04/16/2019  . Internal hemorrhoids 08/21/2018  . Major depressive disorder   . Muscle fasciculation 12/22/2019  . Muscle weakness   . Obstructive sleep apnea    confirmed via sleep test; no current CPAP use  . Ringing in ear     Past Surgical History:  Procedure Laterality Date  . COLONOSCOPY    . URETEROSCOPY      There were no vitals filed for this visit.   Subjective Assessment - 08/03/20 1022    Subjective Pt reports dizziness is about the same; states he has been trying to walk a little faster but it is difficult    Patient Stated Goals Improve unsteadiness    Currently in Pain? Yes    Pain Score 4     Pain Location Head    Pain Orientation Anterior    Pain Descriptors / Indicators Headache    Pain Type Chronic pain    Pain Onset Today                             [facility] Adult PT Treatment/Exercise -  08/04/20 0001      Knee/Hip Exercises: Aerobic   Elliptical level 3.0,  3" forward               Balance Exercises - 08/04/20 0001      Balance Exercises: Standing   Standing Eyes Opened Wide (BOA);Head turns;Foam/compliant surface;5 reps   horizontal & vertical   Standing Eyes Closed Head turns;Wide (BOA);Narrow base of support (BOS);5 reps   horizontal & vertical   Rockerboard Anterior/posterior;EO;EC;10 reps    Gait with Head Turns Forward;2 reps   40'   Marching Foam/compliant surface;Static;10 reps   on 1 pillow in corner   Other Standing Exercises pt performed rotations standing on pillows - turning side to side and then diagonals "X" 5 reps each with EO and EC    Other Standing Exercises Comments amb. 50' tossing ball and catching - straight up, then Rt/Lt sides 50' x 1 rep;  walking backward 40' x 1 rep tossing and catching ball with CGA               PT Short Term Goals - 08/04/20 1739  PT SHORT TERM GOAL #1   Title Patient will be able to ambulate with horizontal/vertical head turns x 100 ft with reports of </= 5/10 dizziness with completion (ALL STGs: 07/23/20)    Baseline 7/10 dizziness    Time 4    Period Weeks    Status New    Target Date 07/23/20             PT Long Term Goals - 08/04/20 1739      PT LONG TERM GOAL #1   Title Pt will be independent with performing HEP for endurance and balance  (ALL LTG due by 07/02/20)    Baseline reports independence with HEP, completing daily    Time 6    Period Weeks    Status Achieved      PT LONG TERM GOAL #2   Title Pt will have improved FGA to >/= 28/30    Baseline 24/30 on eval (05/21/20); 25/30    Time 6    Period Weeks    Status Not Met      PT LONG TERM GOAL #3   Title Pt will report symptoms </=1/5 when checking motion sensitivities    Baseline continue to have moderate dizziness (3/5) with positional changes,, some have improved to 1/5    Time 6    Period Weeks    Status Partially Met       PT LONG TERM GOAL #4   Title Pt will have improved DHI to </=60/100 to decrease to moderate severity    Baseline 86/100 currently rated as severe; 92/100 on 12/17    Time 6    Period Weeks    Status Not Met      PT LONG TERM GOAL #5   Title Pt will have improved FOTO score to >/= 68%    Baseline 45% score at eval (05/21/20); 45% on 12/17    Time 6    Period Weeks    Status Not Met      PT LONG TERM GOAL #6   Title Pt will increase distance on 6 minute walk by 200' and will report </= 4/10 dizziness after performing    Baseline 831', 7-8/10 dizziness    Time 6    Period Weeks    Status On-going                 Plan - 08/03/20 1027    Clinical Impression Statement Pt continues to c/o dizziness with activities requiring increased vestibular input and gaze stabilization.  Pt noted to be improving with gait speed, but self selected gait speed remains moderately decreased.  Cont with POC.    Personal Factors and Comorbidities Age    Examination-Activity Limitations Locomotion Level;Squat;Stairs;Lift;Bend    Examination-Participation Restrictions Interpersonal Relationship;Occupation;Cleaning;Yard Work;Community Activity    Stability/Clinical Decision Making Evolving/Moderate complexity    Rehab Potential Good    PT Frequency 1x / week    PT Duration 6 weeks    PT Treatment/Interventions ADLs/Self Care Home Management;Electrical Stimulation;Moist Heat;Iontophoresis 4mg /ml Dexamethasone;Cryotherapy;DME Instruction;Gait training;Stair training;Functional mobility training;Therapeutic activities;Therapeutic exercise;Balance training;Neuromuscular re-education;Patient/family education;Manual techniques;Energy conservation;Taping;Vestibular    PT Next Visit Plan Cont with balance/vestibular exercises - No true vertigo; feels like he is rocking on a boat -constant.   Continue to progress x1 viewing, habituation to walking faster speeds.    Consulted and Agree with Plan of Care  Patient           Patient will benefit from skilled therapeutic intervention in order to improve  the following deficits and impairments:  Decreased balance,Decreased endurance,Decreased mobility,Difficulty walking,Dizziness,Decreased activity tolerance  Visit Diagnosis: Unsteadiness on feet  Dizziness and giddiness  Difficulty in walking, not elsewhere classified     Problem List Patient Active Problem List   Diagnosis Date Noted  . Obstructive sleep apnea   . Generalized anxiety disorder   . Major depressive disorder   . Weakness 12/22/2019  . Muscle fasciculation 12/22/2019  . Fatigue 10/30/2019  . History of COVID-19 08/2019  . Hypothyroidism 04/16/2019  . Internal hemorrhoids 08/21/2018  . Acute pericarditis   . Globus sensation   . Essential hypertension   . Empty sella syndrome   . Chest pain with low risk for cardiac etiology 12/22/2015    Alda Lea, PT 08/04/2020, 5:40 PM  [facility] [address] [place], [place], [address] Phone: [phone]   Fax:  [phone]  Name: Amritpal Shropshire MRN: [id number] Date of Birth: 1970-08-16

## 2020-08-10 ENCOUNTER — Ambulatory Visit: Payer: 59 | Admitting: Occupational Therapy

## 2020-08-10 ENCOUNTER — Other Ambulatory Visit: Payer: Self-pay

## 2020-08-10 ENCOUNTER — Ambulatory Visit: Payer: 59 | Attending: *Deleted | Admitting: Physical Therapy

## 2020-08-10 ENCOUNTER — Ambulatory Visit: Payer: 59

## 2020-08-10 DIAGNOSIS — R2681 Unsteadiness on feet: Secondary | ICD-10-CM | POA: Diagnosis present

## 2020-08-10 DIAGNOSIS — R41840 Attention and concentration deficit: Secondary | ICD-10-CM | POA: Insufficient documentation

## 2020-08-10 DIAGNOSIS — R41844 Frontal lobe and executive function deficit: Secondary | ICD-10-CM | POA: Diagnosis present

## 2020-08-10 DIAGNOSIS — R41842 Visuospatial deficit: Secondary | ICD-10-CM | POA: Insufficient documentation

## 2020-08-10 DIAGNOSIS — R41841 Cognitive communication deficit: Secondary | ICD-10-CM

## 2020-08-10 DIAGNOSIS — R262 Difficulty in walking, not elsewhere classified: Secondary | ICD-10-CM | POA: Diagnosis present

## 2020-08-10 DIAGNOSIS — R42 Dizziness and giddiness: Secondary | ICD-10-CM | POA: Diagnosis present

## 2020-08-10 NOTE — Patient Instructions (Signed)
   Neuropsychologists in the area:  [facility]  [phone] Thayer Headings, PsyD Arley Phenix, PsyD   [facility]  [phone] Clayborn Heron, PsyD

## 2020-08-10 NOTE — Therapy (Signed)
[facility] [address] [place], [place], [address] Phone: [phone]   Fax:  [phone]  Speech Language Pathology Treatment  Patient Details  Name: Michael Warren MRN: [id number] Date of Birth: 03/03/1971 Referring Provider (SLP): Hope Pigeon, MD   Encounter Date: 08/10/2020   End of Session - 08/10/20 2324    Visit Number 5    Number of Visits 16    Date for SLP Re-Evaluation 09/06/20    SLP Start Time 0850    SLP Stop Time  0930    SLP Time Calculation (min) 40 min    Activity Tolerance Patient tolerated treatment well           Past Medical History:  Diagnosis Date  . Acute pericarditis   . Brain fog    stemming from COVID-19  . Empty sella syndrome    partial; on hormone replacement  . Esophageal ulcer   . Essential hypertension   . Fatigue 10/30/2019  . Generalized anxiety disorder   . History of COVID-19 08/2019  . Hypothyroidism 04/16/2019  . Internal hemorrhoids 08/21/2018  . Major depressive disorder   . Muscle fasciculation 12/22/2019  . Muscle weakness   . Obstructive sleep apnea    confirmed via sleep test; no current CPAP use  . Ringing in ear     Past Surgical History:  Procedure Laterality Date  . COLONOSCOPY    . URETEROSCOPY      There were no vitals filed for this visit.   Subjective Assessment - 08/10/20 0855    Subjective Pt  has not purchased headphones.    Currently in Pain? Yes    Pain Score 3     Pain Location Head    Pain Orientation Other (Comment)   "all-eoncompassing"   Pain Descriptors / Indicators Headache    Pain Type Chronic pain    Pain Onset More than a month ago    Pain Frequency Constant    Aggravating Factors  "nothing really"    Pain Relieving Factors "haven't really noticed"    Effect of Pain on Daily Activities decreases motivation    Multiple Pain Sites Yes    Pain Score 6    Pain Location Abdomen    Pain Orientation Other (Comment)    Pain  Descriptors / Indicators --   sea-sickness   Pain Type Chronic pain    Pain Onset More than a month ago    Pain Frequency Constant    Aggravating Factors  movement, driving    Pain Relieving Factors nothing    Effect of Pain on Daily Activities harder to concentrate                 ADULT SLP TREATMENT - 08/10/20 0904      General Information   Behavior/Cognition Alert;Cooperative;Requires cueing      Treatment Provided   Treatment provided Cognitive-Linquistic      Cognitive-Linquistic Treatment   Treatment focused on Cognition;Patient/family/caregiver education    Skilled Treatment Michael Warren describes no real changes from last session re: attention difficulties at work. He has not called [name] to inquire about another attention med, and has not bought headphones to cancel noise. Pt made an alarm in his phone to purchase some today at 1pm, with SLP suggestion. Michael Warren tells SLP [name] helps with "going longer". SLP provided pt with neuropsych information due to pt endorsing frustration about changes post-COVID ("It sucks all the (expletive) energy out of you."). SLP worked with pt  on desenstization of auditory signal in detailed tasks and after 10-12 mintues pt stated, "It's like once (lack of attention due to noise) starts it just keeps buzzing." SLP encouarged pt to purchase headphones to see if this is any different and reiterated to pt that this would be a time, if he were at work, to go outside for a break from the office - pt stated he does exactly that when he is at work.      Assessment / Recommendations / Plan   Plan Continue with current plan of care      Progression Toward Goals   Progression toward goals Not progressing toward goals (comment)   ? psych component decr'ing pt motivation             SLP Short Term Goals - 08/10/20 2321      SLP SHORT TERM GOAL #1   Title pt will complete functional mod complex alternating attention tasks with modified  independence in 3 sessions    Baseline 08-03-20    Time 1    Period Weeks    Status On-going      SLP SHORT TERM GOAL #2   Title pt will demo functional linguistic processing (auditory and written intormation) in structured tasks with rare min-mod A in 3 sessions    Baseline 08-10-20    Time 1    Period Weeks    Status On-going      SLP SHORT TERM GOAL #3   Title pt will demo appropriate skills with appointment management, completing to-do lists, and other more simple functional tasks involving problem solving and organization in 3 sessions    Baseline 08-03-20    Time 1    Period Weeks    Status On-going            SLP Long Term Goals - 08/10/20 2322      SLP LONG TERM GOAL #1   Title pt will complete functional simple tasks using divided attention in 3 sessions    Time 5    Period Weeks   or 17 sessions,for all LTGs   Status On-going      SLP LONG TERM GOAL #2   Title pt will demo functional linguistic processing (auditory and written intormation) in work-like tasks in 3 sessions    Time 5    Period Weeks    Status On-going      SLP LONG TERM GOAL #3   Title pt will demo appropriate skills with financial management, scheduling, and other functional tasks involving problem solving and organization in 3 sessions    Time 5    Period Weeks    Status On-going             Patient will benefit from skilled therapeutic intervention in order to improve the following deficits and impairments:   Cognitive communication deficit    Problem List Patient Active Problem List   Diagnosis Date Noted  . Obstructive sleep apnea   . Generalized anxiety disorder   . Major depressive disorder   . Weakness 12/22/2019  . Muscle fasciculation 12/22/2019  . Fatigue 10/30/2019  . History of COVID-19 08/2019  . Hypothyroidism 04/16/2019  . Internal hemorrhoids 08/21/2018  . Acute pericarditis   . Globus sensation   . Essential hypertension   . Empty sella syndrome   . Chest  pain with low risk for cardiac etiology 12/22/2015    [facility] ,MS, CCC-SLP  08/10/2020, 11:25 PM  [facility]  [facility] [address] [place], [place], [address] Phone: [phone]   Fax:  [phone]   Name: Michael Warren MRN: [id number] Date of Birth: July 06, 1971

## 2020-08-10 NOTE — Therapy (Signed)
[facility] [address] [place], [place], [address] Phone: [phone]   Fax:  [phone]  Occupational Therapy Treatment  Patient Details  Name: Michael Warren MRN: [id number] Date of Birth: 16-Jun-1971 Referring Provider (OT): Dr. Sharlene Dory   Encounter Date: 08/10/2020   OT End of Session - 08/10/20 1123    Visit Number 7    Number of Visits 17    Date for OT Re-Evaluation 09/06/20    Authorization Type [employer] 2021:  60 visit limit combined OT, PT, ST    Authorization Time Period wk 7/12    OT Start Time 1020    OT Stop Time 1100    OT Time Calculation (min) 40 min    Activity Tolerance Patient tolerated treatment well    Behavior During Therapy [facility] for tasks assessed/performed           Past Medical History:  Diagnosis Date  . Acute pericarditis   . Brain fog    stemming from COVID-19  . Empty sella syndrome    partial; on hormone replacement  . Esophageal ulcer   . Essential hypertension   . Fatigue 10/30/2019  . Generalized anxiety disorder   . History of COVID-19 08/2019  . Hypothyroidism 04/16/2019  . Internal hemorrhoids 08/21/2018  . Major depressive disorder   . Muscle fasciculation 12/22/2019  . Muscle weakness   . Obstructive sleep apnea    confirmed via sleep test; no current CPAP use  . Ringing in ear     Past Surgical History:  Procedure Laterality Date  . COLONOSCOPY    . URETEROSCOPY      There were no vitals filed for this visit.   Subjective Assessment - 08/10/20 1023    Subjective  The headache never really goes away. Dizziness 6/10 - it's coming down    Pertinent History Visual impairments after Covid-19 (08/2019) with dizziness, fatigue, brain fog.    PMH:  sleep apnea, Empty sella syndrome (CMS-HCC), Anxiety, Depression, Hypertension, Hypothyroid, Pericarditis, PVC (premature ventricular contraction)  and Tinnitus    Patient Stated Goals to get better    Currently in Pain? Yes     Pain Score 3     Pain Location Head    Pain Descriptors / Indicators Headache    Pain Type Chronic pain    Pain Onset Today    Pain Frequency Constant    Aggravating Factors  lights    Pain Relieving Factors nothing    Pain Onset More than a month ago           Reviewed previously discussed recommendations for task modifications and/or adaptations and visual strategies. Also discussed changing computer and phone screen to white on black or night mode, going to familiar grocery store/drugstore during less busy times and having list already planned to avoid wasted back and forth t/o store. Discussed different options for glasses to prevent glare. Also discussed safety with cooking and need for alarms but also distant supervision recommended and no distractions (turn cell phone off or do not disturb mode, etc) Tabletop visual scanning w/ cognitive component to cross out #'s in order and cross out #s that repeat 4x with 100% accuracy                       OT Short Term Goals - 08/10/20 1048      OT SHORT TERM GOAL #1   Title Pt will verbalize understanding of visual compensation strategies for  ADLs and IADLs.--check STGs12/30/21    Time 4    Period Weeks    Status Achieved      OT SHORT TERM GOAL #2   Title Pt will verbalize understanding of memory and cognitive compensation stategies for ADLs/IADLs including work tasks.    Time 4    Period Weeks    Status Achieved      OT SHORT TERM GOAL #3   Title Pt will be able to perform seated functional visual scanning tasks such as reading, working on computer, scanning for at least 74mn without rest break or significantly decreasing speed and with dizziness/nausea rated at 3/10 or less.    Time 4    Period Weeks    Status Partially Met   08/03/20:  no rest or significant decr in speed, maintains 5/10 dizziness/nausea (pt reports that 5/10 is baseline)     OT SHORT TERM GOAL #4   Title Pt will be able to perform  dynamic visual scanning/environmental scanning in busy environment with at least 95% accuracy and dizziness/nausea rated at 3/10 or less for incr ease with work tasks and community activities/shopping.    Time 4    Period Weeks    Status On-going   dizziness continues to be b/t 5-6/10.  08/03/20:  continues to rate dizziness/nausea at 6/10, 93% accuracy     OT SHORT TERM GOAL #5   Title Pt will perform alternating attention for IADLs(cooking) and simulated work tasks with at least 75% accuracy.    Time 4    Period Weeks    Status On-going             OT Long Term Goals - 08/10/20 1121      OT LONG TERM GOAL #1   Title Pt will be independent with home program for vision, functional cognition, and IADL participation.--check LTGs 09/08/20    Time 12    Period Weeks    Status Achieved      OT LONG TERM GOAL #2   Title Pt will be able to perform environmental scanning with simple divided attention with at least 90% accuracy.    Time 12    Period Weeks    Status New      OT LONG TERM GOAL #3   Title Pt will perform alternating attention for IADLs(cooking) and simulated work tasks with at least 90% accuracy.    Time 12    Period Weeks    Status New      OT LONG TERM GOAL #4   Title Pt will perform visually demanding tasks (reading, scanning, environmental scanning, computer work) for at least 25 min without rest and dizziness/nausea less than or equal to 3/10.    Time 12    Period Weeks    Status Not Met   approx 15 min. 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;Vision;IADL    Cognitive Skills Memory;Attention    Rehab Potential Good  Clinical Decision Making Several treatment options, min-mod task modification necessary    Comorbidities Affecting Occupational Performance: May have comorbidities impacting occupational performance    Modification or Assistance to Complete Evaluation  Min-Moderate modification of tasks or assist with assess necessary to complete eval    OT Frequency Other (comment)   See below   OT Duration --   2x/week for 4 weeks followed by 1x/week for 8 weeks, +eval = 17 visits OT   OT Treatment/Interventions Self-care/ADL training;Aquatic Therapy;Coping strategies training;Therapeutic activities;Cognitive remediation/compensation;Visual/perceptual remediation/compensation;Patient/family education;Therapeutic exercise    Plan check remaining goals, possible cooking task alternating attn, d/c next session (pt in agreement)    Consulted and Agree with Plan of Care Patient           Patient will benefit from skilled therapeutic intervention in order to improve the following deficits and impairments:   Body Structure / Function / Physical Skills: ADL,Vision,IADL Cognitive Skills: Memory,Attention     Visit Diagnosis: Visuospatial deficit  Attention and concentration deficit  Frontal lobe and executive function deficit    Problem List Patient Active Problem List   Diagnosis Date Noted  . Obstructive sleep apnea   . Generalized anxiety disorder   . Major depressive disorder   . Weakness 12/22/2019  . Muscle fasciculation 12/22/2019  . Fatigue 10/30/2019  . History of COVID-19 08/2019  . Hypothyroidism 04/16/2019  . Internal hemorrhoids 08/21/2018  . Acute pericarditis   . Globus sensation   . Essential hypertension   . Empty sella syndrome   . Chest pain  with low risk for cardiac etiology 12/22/2015    Carey Bullocks, OTR/L 08/10/2020, 11:25 AM  [facility] [address] [place], [place], [address] Phone: [phone]   Fax:  [phone]  Name: Michael Warren MRN: [id number] Date of Birth: [birth year]

## 2020-08-11 ENCOUNTER — Encounter: Payer: Self-pay | Admitting: Physical Therapy

## 2020-08-11 NOTE — Therapy (Signed)
[facility] [address] [place], [place], [address] Phone: [phone]   Fax:  [phone]  Physical Therapy Treatment  Patient Details  Name: Michael Warren MRN: [id number] Date of Birth: Nov 16, 1970 Referring Provider (PT): Sharlene Dory, MD   Encounter Date: 08/10/2020   PT End of Session - 08/11/20 1859    Visit Number 10    Number of Visits 15    Date for PT Re-Evaluation 09/23/20    Authorization Type UHC    PT Start Time 0934    PT Stop Time 1015    PT Time Calculation (min) 41 min    Activity Tolerance Patient tolerated treatment well    Behavior During Therapy [facility] for tasks assessed/performed           Past Medical History:  Diagnosis Date  . Acute pericarditis   . Brain fog    stemming from COVID-19  . Empty sella syndrome    partial; on hormone replacement  . Esophageal ulcer   . Essential hypertension   . Fatigue 10/30/2019  . Generalized anxiety disorder   . History of COVID-19 08/2019  . Hypothyroidism 04/16/2019  . Internal hemorrhoids 08/21/2018  . Major depressive disorder   . Muscle fasciculation 12/22/2019  . Muscle weakness   . Obstructive sleep apnea    confirmed via sleep test; no current CPAP use  . Ringing in ear     Past Surgical History:  Procedure Laterality Date  . COLONOSCOPY    . URETEROSCOPY      There were no vitals filed for this visit.                      [facility] Adult PT Treatment/Exercise - 08/11/20 0001      Transfers   Transfers Sit to Stand;Stand to Sit    Number of Reps Other reps (comment)   5   Comments feet on [employer]ire[employer] - no UE support from mat      Knee/Hip Exercises: Aerobic   Elliptical level 3.0,  3" forward               Balance Exercises - 08/11/20 0001      Balance Exercises: Standing   Partial Tandem Stance Eyes open;Foam/compliant surface;5 reps   horizontal & vertical head turns   Turning 3 reps   to Rt and Lt sides =  6 reps total - standing on blue mat   Marching Foam/compliant surface;Static;10 reps   on 1 pillow in corner   Other Standing Exercises pt performed rotations standing on pillows - turning side to side and then diagonals "X" 5 reps each with EO and EC    Other Standing Exercises Comments amb. 50' tossing ball and catching - straight up, then Rt/Lt sides 50' x 1 rep;  walking backward 40' x 1 rep tossing and catching ball with CGA             PT Education - 08/11/20 1857    Education Details discussed benefits of aquatic therapy exercise with pt - able to safely practice higher level balance activities in water than what can be done on land    Person(s) Educated Patient    Methods Explanation    Comprehension Verbalized understanding            PT Short Term Goals - 08/11/20 1905      PT SHORT TERM GOAL #1   Title Patient will be able to ambulate with  horizontal/vertical head turns x 100 ft with reports of </= 5/10 dizziness with completion (ALL STGs: 07/23/20)    Baseline 7/10 dizziness    Time 4    Period Weeks    Status New    Target Date 07/23/20             PT Long Term Goals - 08/11/20 1905      PT LONG TERM GOAL #1   Title Pt will be independent with performing HEP for endurance and balance  (ALL LTG due by 07/02/20)    Baseline reports independence with HEP, completing daily    Time 6    Period Weeks    Status Achieved      PT LONG TERM GOAL #2   Title Pt will have improved FGA to >/= 28/30    Baseline 24/30 on eval (05/21/20); 25/30    Time 6    Period Weeks    Status Not Met      PT LONG TERM GOAL #3   Title Pt will report symptoms </=1/5 when checking motion sensitivities    Baseline continue to have moderate dizziness (3/5) with positional changes,, some have improved to 1/5    Time 6    Period Weeks    Status Partially Met      PT LONG TERM GOAL #4   Title Pt will have improved DHI to </=60/100 to decrease to moderate severity    Baseline 86/100  currently rated as severe; 92/100 on 12/17    Time 6    Period Weeks    Status Not Met      PT LONG TERM GOAL #5   Title Pt will have improved FOTO score to >/= 68%    Baseline 45% score at eval (05/21/20); 45% on 12/17    Time 6    Period Weeks    Status Not Met      PT LONG TERM GOAL #6   Title Pt will increase distance on 6 minute walk by 200' and will report </= 4/10 dizziness after performing    Baseline 831', 7-8/10 dizziness    Time 6    Period Weeks    Status On-going                 Plan - 08/11/20 1901    Clinical Impression Statement Pt continues to c/o dizziness and some mild c/o nausea with vestibular stimulation exercises.  Turning 180 with up/down reaches provoked moderate dizziness.  Legs trembled with standing on compliant surfaces and also with amb, with visual tracking activity with ball - pt stated that sometimes his legs will partially give out, but states he has been able to catch himself and not fall.  Cont with POC.    Personal Factors and Comorbidities Age    Examination-Activity Limitations Locomotion Level;Squat;Stairs;Lift;Bend    Examination-Participation Restrictions Interpersonal Relationship;Occupation;Cleaning;Yard Work;Community Activity    Stability/Clinical Decision Making Evolving/Moderate complexity    Rehab Potential Good    PT Frequency 1x / week    PT Duration 6 weeks    PT Treatment/Interventions ADLs/Self Care Home Management;Electrical Stimulation;Moist Heat;Iontophoresis 4mg /ml Dexamethasone;Cryotherapy;DME Instruction;Gait training;Stair training;Functional mobility training;Therapeutic activities;Therapeutic exercise;Balance training;Neuromuscular re-education;Patient/family education;Manual techniques;Energy conservation;Taping;Vestibular    PT Next Visit Plan Cont with balance/vestibular exercises - No true vertigo; feels like he is rocking on a boat -constant.   Continue to progress x1 viewing, habituation to walking faster  speeds.    Consulted and Agree with Plan of Care Patient  Patient will benefit from skilled therapeutic intervention in order to improve the following deficits and impairments:  Decreased balance,Decreased endurance,Decreased mobility,Difficulty walking,Dizziness,Decreased activity tolerance  Visit Diagnosis: Unsteadiness on feet  Dizziness and giddiness  Difficulty in walking, not elsewhere classified     Problem List Patient Active Problem List   Diagnosis Date Noted  . Obstructive sleep apnea   . Generalized anxiety disorder   . Major depressive disorder   . Weakness 12/22/2019  . Muscle fasciculation 12/22/2019  . Fatigue 10/30/2019  . History of COVID-19 08/2019  . Hypothyroidism 04/16/2019  . Internal hemorrhoids 08/21/2018  . Acute pericarditis   . Globus sensation   . Essential hypertension   . Empty sella syndrome   . Chest pain with low risk for cardiac etiology 12/22/2015    Alda Lea, PT 08/11/2020, 7:07 PM  [facility] [address] [place], [place], [address] Phone: [phone]   Fax:  [phone]  Name: Michael Warren MRN: [id number] Date of Birth: 10-Jan-1971

## 2020-08-17 ENCOUNTER — Ambulatory Visit: Payer: 59

## 2020-08-17 ENCOUNTER — Ambulatory Visit: Payer: 59 | Admitting: Physical Therapy

## 2020-08-17 ENCOUNTER — Ambulatory Visit: Payer: 59 | Admitting: Occupational Therapy

## 2020-08-17 ENCOUNTER — Other Ambulatory Visit: Payer: Self-pay

## 2020-08-17 DIAGNOSIS — R42 Dizziness and giddiness: Secondary | ICD-10-CM

## 2020-08-17 DIAGNOSIS — R2681 Unsteadiness on feet: Secondary | ICD-10-CM

## 2020-08-17 DIAGNOSIS — R41844 Frontal lobe and executive function deficit: Secondary | ICD-10-CM

## 2020-08-17 DIAGNOSIS — R41841 Cognitive communication deficit: Secondary | ICD-10-CM

## 2020-08-17 DIAGNOSIS — R41840 Attention and concentration deficit: Secondary | ICD-10-CM

## 2020-08-17 DIAGNOSIS — R41842 Visuospatial deficit: Secondary | ICD-10-CM

## 2020-08-17 NOTE — Therapy (Signed)
[facility] [address] [place], [place], [address] Phone: [phone]   Fax:  [phone]  Occupational Therapy Treatment  Patient Details  Name: Michael Warren MRN: [id number] Date of Birth: Dec 14, 1970 Referring Provider (OT): Dr. Sharlene Dory   Encounter Date: 08/17/2020   OT End of Session - 08/17/20 1349    Visit Number 8    Number of Visits 17    Date for OT Re-Evaluation 09/06/20    Authorization Type [employer] 2021:  60 visit limit combined OT, PT, ST    Authorization Time Period wk 8/12    OT Start Time 0934    OT Stop Time 1015    OT Time Calculation (min) 41 min    Activity Tolerance Patient tolerated treatment well    Behavior During Therapy [facility] for tasks assessed/performed           Past Medical History:  Diagnosis Date  . Acute pericarditis   . Brain fog    stemming from COVID-19  . Empty sella syndrome    partial; on hormone replacement  . Esophageal ulcer   . Essential hypertension   . Fatigue 10/30/2019  . Generalized anxiety disorder   . History of COVID-19 08/2019  . Hypothyroidism 04/16/2019  . Internal hemorrhoids 08/21/2018  . Major depressive disorder   . Muscle fasciculation 12/22/2019  . Muscle weakness   . Obstructive sleep apnea    confirmed via sleep test; no current CPAP use  . Ringing in ear     Past Surgical History:  Procedure Laterality Date  . COLONOSCOPY    . URETEROSCOPY      There were no vitals filed for this visit.   Subjective Assessment - 08/17/20 1348    Subjective  Pt reports that dizziness continues to stay at 5/10 at baseline    Pertinent History Visual impairments after Covid-19 (08/2019) with dizziness, fatigue, brain fog.    PMH:  sleep apnea, Empty sella syndrome (CMS-HCC), Anxiety, Depression, Hypertension, Hypothyroid, Pericarditis, PVC (premature ventricular contraction)  and Tinnitus    Patient Stated Goals to get better    Currently in Pain? No/denies     Pain Onset Today    Pain Onset More than a month ago           Modified Multiple Elements Tasks for divided attention:  Pt did not monitor time and went over by >54mn prior to therapist stopping pt.  Pt with decr attention to details as he did single items <$40 for catalog task vs. COMBINATIONS of items.  For card activity, pt added amounts instead of writing single amount.  Other rules were followed and accurate.  However, pt also reports discomfort/incr symptoms after 143m.  Environmental scanning in min busy environment with simple divided attention (ambulation with tossing scarf from 1 hand to the other) with 100% accuracy.  Then ambulation with tossing scarf and performing category generation with min difficulty, but rated dizziness at 6-7/10 (5/10 at baseline).  Checked remaining goals and discussed progress.  Reviewed compensation strategies for vision and cognition and that pt will need to continue these for work tasks.  Discussed multiple compensations together may be more effective rather than isolated compensations.  Also discussed/reviewed activities for home to incr tolerance (when able) and that he should perform to threshold where his symptoms mildly, but no overdo it or to perform when safety is a concern/or when divided attention can decr safety (like while cooking).  Reviewed appropriate activities (read with tv  on, category generation/cognitive activities with ambulation, incr distractions and cognitive challenge).  Pt verbalized understanding.       OT Short Term Goals - 08/17/20 0941      OT SHORT TERM GOAL #1   Title Pt will verbalize understanding of visual compensation strategies for ADLs and IADLs.--check STGs12/30/21    Time 4    Period Weeks    Status Achieved      OT SHORT TERM GOAL #2   Title Pt will verbalize understanding of memory and cognitive compensation stategies for ADLs/IADLs including work tasks.    Time 4    Period Weeks    Status Achieved       OT SHORT TERM GOAL #3   Title Pt will be able to perform seated functional visual scanning tasks such as reading, working on computer, scanning for at least 46mn without rest break or significantly decreasing speed and with dizziness/nausea rated at 3/10 or less.    Time 4    Period Weeks    Status Partially Met   08/03/20:  no rest or significant decr in speed, maintains 5/10 dizziness/nausea (pt reports that 5/10 is baseline)     OT SHORT TERM GOAL #4   Title Pt will be able to perform dynamic visual scanning/environmental scanning in busy environment with at least 95% accuracy and dizziness/nausea rated at 3/10 or less for incr ease with work tasks and community activities/shopping.    Time 4    Period Weeks    Status Not Met   dizziness continues to be b/t 5-6/10.  08/03/20:  continues to rate dizziness/nausea at 6/10, 93% accuracy     OT SHORT TERM GOAL #5   Title Pt will perform alternating attention for IADLs(cooking) and simulated work tasks with at least 75% accuracy.    Time 4    Period Weeks    Status Achieved   08/17/20:  per pt with alternating attention (unable to divide attention)            OT Long Term Goals - 08/17/20 0937      OT LONG TERM GOAL #1   Title Pt will be independent with home program for vision, functional cognition, and IADL participation.--check LTGs 09/08/20    Time 12    Period Weeks    Status Achieved      OT LONG TERM GOAL #2   Title Pt will be able to perform environmental scanning with simple divided attention with at least 90% accuracy.    Time 12    Period Weeks    Status Achieved      OT LONG TERM GOAL #3   Title Pt will perform alternating attention for IADLs (cooking) and simulated work tasks with at least 90% accuracy.    Time 12    Period Weeks    Status Achieved   08/17/20:  met at approx this level per pt report for alternating attention (difficulty with divided attention)     OT LONG TERM GOAL #4   Title Pt will perform visually  demanding tasks (reading, scanning, environmental scanning, computer work) for at least 25 min without rest and dizziness/nausea less than or equal to 3/10.    Time 12    Period Weeks    Status Not Met   approx 15 min. before rest, dizziness b/t 5-6/10           Plan - 08/17/20 1349    Clinical Impression Statement Pt with increased understanding of task modifications  and compensatory strategies to decrease over stimulation and prevent increase in symptoms.  Pt also demo overall improved activity tolerance from eval, but progress is slow.    OT Occupational Profile and History Detailed Assessment- Review of Records and additional review of physical, cognitive, psychosocial history related to current functional performance    Occupational performance deficits (Please refer to evaluation for details): ADL's;IADL's;Leisure;Social Participation;Work    Marketing executive / Function / Physical Skills ADL;Vision;IADL    Cognitive Skills Memory;Attention    Rehab Potential Good    Clinical Decision Making Several treatment options, min-mod task modification necessary    Comorbidities Affecting Occupational Performance: May have comorbidities impacting occupational performance    Modification or Assistance to Complete Evaluation  Min-Moderate modification of tasks or assist with assess necessary to complete eval    OT Frequency Other (comment)   See below   OT Duration --   2x/week for 4 weeks followed by 1x/week for 8 weeks, +eval = 17 visits OT   OT Treatment/Interventions Self-care/ADL training;Aquatic Therapy;Coping strategies training;Therapeutic activities;Cognitive remediation/compensation;Visual/perceptual remediation/compensation;Patient/family education;Therapeutic exercise    Plan d/c OT (pt in agreement)    Consulted and Agree with Plan of Care Patient           Patient will benefit from skilled therapeutic intervention in order to improve the following deficits and impairments:   Body  Structure / Function / Physical Skills: ADL,Vision,IADL Cognitive Skills: Memory,Attention     Visit Diagnosis: Visuospatial deficit  Attention and concentration deficit  Frontal lobe and executive function deficit  Unsteadiness on feet    Problem List Patient Active Problem List   Diagnosis Date Noted  . Obstructive sleep apnea   . Generalized anxiety disorder   . Major depressive disorder   . Weakness 12/22/2019  . Muscle fasciculation 12/22/2019  . Fatigue 10/30/2019  . History of COVID-19 08/2019  . Hypothyroidism 04/16/2019  . Internal hemorrhoids 08/21/2018  . Acute pericarditis   . Globus sensation   . Essential hypertension   . Empty sella syndrome   . Chest pain with low risk for cardiac etiology 12/22/2015    OCCUPATIONAL THERAPY DISCHARGE SUMMARY  Visits from Start of Care: 8  Current functional level related to goals / functional outcomes: See above   Remaining deficits: Cognitive deficits (including decr divided attention), decr tolerance for visual activities/dizziness   Education / Equipment: Pt educated in cognitive and visual compensation strategies, activities for home.  Pt verbalized understanding of education provided.  Plan: Patient agrees to discharge.  Patient goals were partially met. Patient is being discharged due to                                                     Reaching maximal rehab potential at this time.  Pt reports that he feels comfortable continuing with strategies/recommendations for home/work.   ?????        [facility] 08/17/2020, 2:35 PM  [place] [address] [place], [place], [address] Phone: [phone]   Fax:  [phone]  Name: Michael Warren MRN: [id number] Date of Birth: [birth year]   Vianne Bulls, OTR/L [facility] [address] [place], [place]  [address] [phone] phone [phone] 08/17/20 2:43  PM

## 2020-08-17 NOTE — Therapy (Signed)
[place] [address] [place], [place], [address] Phone: [phone]   Fax:  [phone]  Speech Language Pathology Treatment-Discharge Summary  Patient Details  Name: Michael Warren MRN: [id number] Date of Birth: Apr 06, 1971 Referring Provider (SLP): Sharlene Dory, MD   Encounter Date: 08/17/2020   End of Session - 08/17/20 1004    Visit Number 6    Number of Visits 16    Date for SLP Re-Evaluation 09/06/20    SLP Start Time 0905    SLP Stop Time  0930    SLP Time Calculation (min) 25 min           Past Medical History:  Diagnosis Date  . Acute pericarditis   . Brain fog    stemming from COVID-19  . Empty sella syndrome    partial; on hormone replacement  . Esophageal ulcer   . Essential hypertension   . Fatigue 10/30/2019  . Generalized anxiety disorder   . History of COVID-19 08/2019  . Hypothyroidism 04/16/2019  . Internal hemorrhoids 08/21/2018  . Major depressive disorder   . Muscle fasciculation 12/22/2019  . Muscle weakness   . Obstructive sleep apnea    confirmed via sleep test; no current CPAP use  . Ringing in ear     Past Surgical History:  Procedure Laterality Date  . COLONOSCOPY    . URETEROSCOPY      There were no vitals filed for this visit.   Subjective Assessment - 08/17/20 0946    Subjective "I was running behind" pt arrived 25 mins late for apt            [place]  Visits from Start of Care: 6  Current functional level related to goals / functional outcomes:  See below for goals. Pt made slight progress in use of compensations for attention, but it appeared at time pt had difficulty with motivation. He continues to struggle with auditory distractions at work, reportedly being able to focus for no longer than 15 mins before having to take a break. It was suggested he contact Lazaro Arms, NP at [facility] re: suggestions for  medication options for auditory distractions. Today, pt has not completed this task.    Remaining deficits: Attention deficits requiring pt to take frequent breaks during work   Education / Equipment: Compensations for selective attention, suggestion for medication management Plan: Patient agrees to discharge.  Patient goals were partially met. Patient is being discharged due to the patient's request.  ?????           ADULT SLP TREATMENT - 08/17/20 0001      General Information   Behavior/Cognition Alert;Cooperative;Requires cueing      Treatment Provided   Treatment provided Cognitive-Linquistic      Cognitive-Linquistic Treatment   Treatment focused on Cognition;Patient/family/caregiver education    Skilled Treatment Pt was 25 mins late today. Pt reported he was having a difficult week and having more trouble focusing. Pt indicated he has been wearing earmuffs at work to reduce noise, which has been effective. NP at [facility] has not been contacted, despite use of external reminders, with pt demonstrating reduced motivation and internal barriers to completing task. SLP reviewed compensatory strategies as educated by SLPs in previous sessions, including environmental modifications for attention, utilizing written aids for recall, taking breaks due to fatigue, etc., in which pt verbalized understanding and endorsed some independent use. SLP discussed discharge as today is last scheduled session. Pt  requested ST discharge and stated "I don't know what else yall can do for me." SLP provided addtional education and strategies. Pt declined further questions or concerns.      Assessment / Recommendations / Plan   Plan Discharge SLP treatment due to (comment)   patient request     Progression Toward Goals   Progression toward goals Not progressing toward goals (comment)            SLP Education - 08/17/20 0943    Education Details compensatory strategies, environmental  modifications, NP            SLP Short Term Goals - 08/17/20 0950      SLP SHORT TERM GOAL #1   Title pt will complete functional mod complex alternating attention tasks with modified independence in 3 sessions    Baseline 08-03-20    Status Partially Met      SLP SHORT TERM GOAL #2   Title pt will demo functional linguistic processing (auditory and written intormation) in structured tasks with rare min-mod A in 3 sessions    Baseline 08-10-20    Status Partially Met      SLP SHORT TERM GOAL #3   Title pt will demo appropriate skills with appointment management, completing to-do lists, and other more simple functional tasks involving problem solving and organization in 3 sessions    Baseline 08-03-20, 08-17-20    Status Partially Met            SLP Long Term Goals - 08/17/20 0954      SLP LONG TERM GOAL #1   Title pt will complete functional simple tasks using divided attention in 3 sessions    Period --   or 17 sessions,for all LTGs   Status Not Met      SLP LONG TERM GOAL #2   Title pt will demo functional linguistic processing (auditory and written intormation) in work-like tasks in 3 sessions    Status Not Met   Due to reported level of auditory distraction still present     SLP LONG TERM GOAL #3   Title pt will demo appropriate skills with financial management, scheduling, and other functional tasks involving problem solving and organization in 3 sessions    Status Partially Met   pt able to complete functional problem solving tasks with breaks           Plan - 08/17/20 0947    Clinical Impression Statement Michael Warren presents today with decr'd cognitive-linguistic skills following contracing [id number] in Februray 2021. Pt continues to voice frustration regarding his deficits, especially due to noise distractions. SLP continues to believe Michael Warren continues to have difficulty with attention (and thus memory), organization/problem solving, and processing. Overall reduced  motivation and frustration exhibited in ST sessions impacting patient performance and carryover of learned techniques. Pt requested ST discharge this date due to limited progress and motivation.    Treatment/Interventions Environmental controls;Functional tasks;Compensatory techniques;Cueing hierarchy;SLP instruction and feedback;Internal/external aids;Patient/family education;Cognitive reorganization    Potential to Achieve Goals Good    SLP Home Exercise Plan environmental modifcations, external aids    Consulted and Agree with Plan of Care Patient           Patient will benefit from skilled therapeutic intervention in order to improve the following deficits and impairments:   Cognitive communication deficit    Problem List Patient Active Problem List   Diagnosis Date Noted  . Obstructive sleep apnea   . Generalized anxiety disorder   .  Major depressive disorder   . Weakness 12/22/2019  . Muscle fasciculation 12/22/2019  . Fatigue 10/30/2019  . History of COVID-19 08/2019  . Hypothyroidism 04/16/2019  . Internal hemorrhoids 08/21/2018  . Acute pericarditis   . Globus sensation   . Essential hypertension   . Empty sella syndrome   . Chest pain with low risk for cardiac etiology 12/22/2015   Session provided and documented by Alinda Deem, MA CCC-SLP   Alinda Deem 08/17/2020, 10:05 AM  [place] [address] [place], [place], [address] Phone: [phone]   Fax:  [phone]   Name: Michael Warren MRN: [id number] Date of Birth: 23-Mar-1971

## 2020-08-17 NOTE — Patient Instructions (Signed)
Compensatory strategies: -Reduce background noise -Taking breaks -Wear earmuff at work -Write down information in common location -Use external aids on phone (reminders, alarm)

## 2020-08-17 NOTE — Therapy (Deleted)
[facility] [address] [place], [place], [address] Phone: [phone]   Fax:  [phone]  August 17, 2020   No Recipients  Speech Language Pathology Therapy Discharge Summary   Patient: Michael Warren  MRN: [id number]  Date of Birth: [birth year]   Diagnosis: Cognitive communication deficit Referring Provider (SLP): Hope Pigeon, MD   The above patient had been seen in Speech Language Pathology *** times of *** treatments scheduled with *** no shows and *** cancellations.  The treatment consisted of *** The patient is: {improved/worse/unchanged:[id number]}  Subjective: ***  Discharge Findings: ***  Functional Status at Discharge: ***  {PQDIY:[id number]}   Plan - 08/17/20 0947    Clinical Impression Statement [name] presents today with decr'd cognitive-linguistic skills following contracing COVID-19 in Febr[place]ay 2021. Pt continues to voice frustration regarding his deficits, especially due to noise distractions. SLP continues to believe Adden continues to have difficulty with attention (and thus memory), organization/problem solving, and processing. Overall reduced motivation and frustration exhibited in ST sessions impacting patient performance and carryover of learned techniques. Pt requested ST discharge this date due to limited progress and motivation.    Treatment/Interventions Environmental controls;Functional tasks;Compensatory techniques;Cueing hierarchy;SLP instruction and feedback;Internal/external aids;Patient/family education;Cognitive reorganization    Potential to Achieve Goals Good    SLP Home Exercise Plan environmental modifcations, external aids    Consulted and Agree with Plan of Care Patient              Sincerely,   Janann Colonel, CCC-SLP    CC No Recipients  [facility] [address] [place], [place], [address] Phone:  [phone]   Fax:  [phone]

## 2020-08-18 ENCOUNTER — Encounter: Payer: Self-pay | Admitting: Physical Therapy

## 2020-08-18 NOTE — Therapy (Signed)
[facility] [address] [place], [place], [address] Phone: [phone]   Fax:  [phone]  Physical Therapy Treatment  Patient Details  Name: Michael Warren MRN: [id number] Date of Birth: 1970/10/11 Referring Provider (PT): Sharlene Dory, MD   Encounter Date: 08/17/2020   PT End of Session - 08/18/20 1144    Visit Number 11    Number of Visits 15    Date for PT Re-Evaluation 09/23/20    Authorization Type UHC    PT Start Time 1020    PT Stop Time 1100    PT Time Calculation (min) 40 min    Activity Tolerance Patient tolerated treatment well    Behavior During Therapy [facility] for tasks assessed/performed           Past Medical History:  Diagnosis Date  . Acute pericarditis   . Brain fog    stemming from COVID-19  . Empty sella syndrome    partial; on hormone replacement  . Esophageal ulcer   . Essential hypertension   . Fatigue 10/30/2019  . Generalized anxiety disorder   . History of COVID-19 08/2019  . Hypothyroidism 04/16/2019  . Internal hemorrhoids 08/21/2018  . Major depressive disorder   . Muscle fasciculation 12/22/2019  . Muscle weakness   . Obstructive sleep apnea    confirmed via sleep test; no current CPAP use  . Ringing in ear     Past Surgical History:  Procedure Laterality Date  . COLONOSCOPY    . URETEROSCOPY      There were no vitals filed for this visit.   Subjective Assessment - 08/18/20 1138    Subjective Pt reports no changes - says things are about the same; pt reports he has been discharged from [facility] and OT today - states he wants to try the aquatic therapy but doesn't want to schedule more land appts at this time (due to plateau in status)    Patient Stated Goals Improve unsteadiness    Currently in Pain? No/denies    Pain Onset More than a month ago              [facility] PT Assessment - 08/18/20 0001      Functional Gait  Assessment   Gait assessed  Yes    Gait Level  Surface Walks 20 ft in less than 5.5 sec, no assistive devices, good speed, no evidence for imbalance, normal gait pattern, deviates no more than 6 in outside of the 12 in walkway width.    Change in Gait Speed Able to smoothly change walking speed without loss of balance or gait deviation. Deviate no more than 6 in outside of the 12 in walkway width.    Gait with Horizontal Head Turns Performs head turns smoothly with no change in gait. Deviates no more than 6 in outside 12 in walkway width    Gait with Vertical Head Turns Performs head turns with no change in gait. Deviates no more than 6 in outside 12 in walkway width.    Gait and Pivot Turn Pivot turns safely within 3 sec and stops quickly with no loss of balance.   mod. dizziness   Step Over Obstacle Is able to step over 2 stacked shoe boxes taped together (9 in total height) without changing gait speed. No evidence of imbalance.    Gait with Narrow Base of Support Is able to ambulate for 10 steps heel to toe with no staggering.    Gait with Eyes  Closed Walks 20 ft, uses assistive device, slower speed, mild gait deviations, deviates 6-10 in outside 12 in walkway width. Ambulates 20 ft in less than 9 sec but greater than 7 sec.    Ambulating Backwards Walks 20 ft, no assistive devices, good speed, no evidence for imbalance, normal gait    Steps Alternating feet, no rail.    Total Score 29                         [facility] Adult PT Treatment/Exercise - 08/18/20 0001      Knee/Hip Exercises: Aerobic   Elliptical level 3.0,  2" forward, 2" backward               Balance Exercises - 08/18/20 0001      Balance Exercises: Standing   Gait with Head Turns Forward;2 reps   40'   Marching Foam/compliant surface;Static;10 reps;Solid surface   initially on floor, with EO and EC; then with head turns; progressed to standing on 2 pillows - EO and EC - with head turns   Other Standing Exercises squats 5 reps EO standing on pillows; 5  reps with EC with SBA - pt had no difficulty with this activity             PT Education - 08/18/20 1144    Education Details pt was given handout on [place] location for aquatic therapy    Person(s) Educated Patient    Methods Explanation;Handout    Comprehension Verbalized understanding            PT Short Term Goals - 08/17/20 1103      PT SHORT TERM GOAL #1   Title Patient will be able to ambulate with horizontal/vertical head turns x 100 ft with reports of </= 5/10 dizziness with completion (ALL STGs: 07/23/20)    Baseline 7/10 dizziness -    08-17-20; pt states dizziness continues to vary in intensity - goal inconsistently met    Time 4    Period Weeks    Status Partially Met    Target Date 07/23/20             PT Long Term Goals - 08/18/20 1151      PT LONG TERM GOAL #1   Title Pt will be independent with performing HEP for endurance and balance  (ALL LTG due by 07/02/20);    Baseline reports independence with HEP, completing daily - aquatic exercises to be added if pt continues to pursue aquatic ex upon D/C from PT    Time 6    Period Weeks    Status On-going      PT LONG TERM GOAL #2   Title Pt will have improved FGA to >/= 28/30    Baseline 24/30 on eval (05/21/20); 25/30;   score 29/30 on 08-17-20    Time 6    Period Weeks    Status Achieved      PT LONG TERM GOAL #3   Title Pt will report symptoms </=1/5 when checking motion sensitivities    Baseline continue to have moderate dizziness (3/5) with positional changes,, some have improved to 1/5; 08-17-20 - pt reports dizziness continues to vary in intensity    Time 6    Period Weeks    Status Not Met      PT LONG TERM GOAL #4   Title Pt will have improved DHI to </=60/100 to decrease to moderate severity  Baseline 86/100 currently rated as severe; 92/100 on 12/17;  score 84% on 08-17-20    Time 6    Period Weeks    Status Not Met      PT LONG TERM GOAL #5   Title Pt will have improved  FOTO score to >/= 68%    Baseline 45% score at eval (05/21/20); 45% on 12/17    Time 6    Period Weeks    Status Not Met      PT LONG TERM GOAL #6   Title Pt will increase distance on 6 minute walk by 200' and will report </= 4/10 dizziness after performing    Baseline 831', 7-8/10 dizziness -  08-17-20 - pt reports dizziness continues to vary in intensity    Time 6    Period Weeks    Status Deferred                 Plan - 08/18/20 1154    Clinical Impression Statement Pt has partially met LTG #1:  Pt has met LTG #2 as FGA score has increased to 29/30.  Pt has not met LTG's #3-5 and LTG #6 was deferred due to ongoing dizziness which is constant but varies in intensity per pt report.  Pt's DHI score has improved from 92% on 06-25-20 to 84% on 06-16-21, but not to stated LTG of </= 60%.  Pt has plateaued in maximizing functional status at this time due to persistent ongoing dizziness which varies in intensity.  Plan is for pt to participate in aquatic therapy for a few sessions to allow increased ease with higher level balance activities and with plyometrics which are difficult for pt to perform/attempt on land.    Personal Factors and Comorbidities Age    Examination-Activity Limitations Locomotion Level;Squat;Stairs;Lift;Bend    Examination-Participation Restrictions Interpersonal Relationship;Occupation;Cleaning;Yard Work;Community Activity    Stability/Clinical Decision Making Evolving/Moderate complexity    Rehab Potential Good    PT Frequency 1x / week    PT Duration 6 weeks    PT Treatment/Interventions ADLs/Self Care Home Management;Electrical Stimulation;Moist Heat;Iontophoresis 56m/ml Dexamethasone;Cryotherapy;DME Instruction;Gait training;Stair training;Functional mobility training;Therapeutic activities;Therapeutic exercise;Balance training;Neuromuscular re-education;Patient/family education;Manual techniques;Energy conservation;Taping;Vestibular    PT Next Visit Plan Cont with  balance/vestibular exercises - No true vertigo; feels like he is rocking on a boat -constant.   Continue to progress x1 viewing, habituation to walking faster speeds.    Consulted and Agree with Plan of Care Patient           Patient will benefit from skilled therapeutic intervention in order to improve the following deficits and impairments:  Decreased balance,Decreased endurance,Decreased mobility,Difficulty walking,Dizziness,Decreased activity tolerance  Visit Diagnosis: Dizziness and giddiness  Unsteadiness on feet     Problem List Patient Active Problem List   Diagnosis Date Noted  . Obstructive sleep apnea   . Generalized anxiety disorder   . Major depressive disorder   . Weakness 12/22/2019  . Muscle fasciculation 12/22/2019  . Fatigue 10/30/2019  . History of COVID-19 08/2019  . Hypothyroidism 04/16/2019  . Internal hemorrhoids 08/21/2018  . Acute pericarditis   . Globus sensation   . Essential hypertension   . Empty sella syndrome   . Chest pain with low risk for cardiac etiology 12/22/2015    DAlda Lea PT 08/18/2020, 12:01 PM  [place] [address]Phone: [phone]  Fax:  [phone] Name: Michael TesslerMRN: [id number] of Birth: [birth year]

## 2020-08-23 ENCOUNTER — Ambulatory Visit: Payer: 59 | Admitting: Physical Therapy

## 2020-08-23 ENCOUNTER — Other Ambulatory Visit: Payer: Self-pay

## 2020-08-23 DIAGNOSIS — R42 Dizziness and giddiness: Secondary | ICD-10-CM

## 2020-08-23 DIAGNOSIS — R2681 Unsteadiness on feet: Secondary | ICD-10-CM | POA: Diagnosis not present

## 2020-08-24 NOTE — Therapy (Signed)
[facility] [address] [place], [place], [address] Phone: [phone]   Fax:  [phone]  Physical Therapy Treatment  Patient Details  Name: Michael Warren MRN: [id number] Date of Birth: [birth year] Referring Provider (PT): Sharlene Dory, MD   Encounter Date: 08/23/2020   PT End of Session - 08/24/20 2235    Visit Number 12    Number of Visits 15    Date for PT Re-Evaluation 09/23/20    Authorization Type UHC    PT Start Time 1500    PT Stop Time 1545    PT Time Calculation (min) 45 min    Activity Tolerance Patient tolerated treatment well    Behavior During Therapy [facility] for tasks assessed/performed           Past Medical History:  Diagnosis Date  . Acute pericarditis   . Brain fog    stemming from COVID-19  . Empty sella syndrome    partial; on hormone replacement  . Esophageal ulcer   . Essential hypertension   . Fatigue 10/30/2019  . Generalized anxiety disorder   . History of COVID-19 08/2019  . Hypothyroidism 04/16/2019  . Internal hemorrhoids 08/21/2018  . Major depressive disorder   . Muscle fasciculation 12/22/2019  . Muscle weakness   . Obstructive sleep apnea    confirmed via sleep test; no current CPAP use  . Ringing in ear     Past Surgical History:  Procedure Laterality Date  . COLONOSCOPY    . URETEROSCOPY      There were no vitals filed for this visit.   Subjective Assessment - 08/24/20 2234    Subjective Pt reports for aquatic therapy at [facility] - reports no changes overall    Patient Stated Goals Improve unsteadiness    Currently in Pain? No/denies    Pain Onset More than a month ago                                       PT Short Term Goals - 08/24/20 2236      PT SHORT TERM GOAL #1   Title Patient will be able to ambulate with horizontal/vertical head turns x 100 ft with reports of </= 5/10 dizziness with completion (ALL STGs: 07/23/20)     Baseline 7/10 dizziness -    08-17-20; pt states dizziness continues to vary in intensity - goal inconsistently met    Time 4    Period Weeks    Status Partially Met    Target Date 07/23/20             PT Long Term Goals - 08/24/20 2236      PT LONG TERM GOAL #1   Title Pt will be independent with performing HEP for endurance and balance  (ALL LTG due by 07/02/20);    Baseline reports independence with HEP, completing daily - aquatic exercises to be added if pt continues to pursue aquatic ex upon D/C from PT    Time 6    Period Weeks    Status On-going      PT LONG TERM GOAL #2   Title Pt will have improved FGA to >/= 28/30    Baseline 24/30 on eval (05/21/20); 25/30;   score 29/30 on 08-17-20    Time 6    Period Weeks    Status Achieved      PT LONG TERM  GOAL #3   Title Pt will report symptoms </=1/5 when checking motion sensitivities    Baseline continue to have moderate dizziness (3/5) with positional changes,, some have improved to 1/5; 08-17-20 - pt reports dizziness continues to vary in intensity    Time 6    Period Weeks    Status Not Met      PT LONG TERM GOAL #4   Title Pt will have improved DHI to </=60/100 to decrease to moderate severity    Baseline 86/100 currently rated as severe; 92/100 on 12/17;  score 84% on 08-17-20    Time 6    Period Weeks    Status Not Met      PT LONG TERM GOAL #5   Title Pt will have improved FOTO score to >/= 68%    Baseline 45% score at eval (05/21/20); 45% on 12/17    Time 6    Period Weeks    Status Not Met      PT LONG TERM GOAL #6   Title Pt will increase distance on 6 minute walk by 200' and will report </= 4/10 dizziness after performing    Baseline 831', 7-8/10 dizziness -  08-17-20 - pt reports dizziness continues to vary in intensity    Time 6    Period Weeks    Status Deferred                  Patient will benefit from skilled therapeutic intervention in order to improve the following deficits and impairments:      Visit Diagnosis: Unsteadiness on feet  Dizziness and giddiness     Problem List Patient Active Problem List   Diagnosis Date Noted  . Obstructive sleep apnea   . Generalized anxiety disorder   . Major depressive disorder   . Weakness 12/22/2019  . Muscle fasciculation 12/22/2019  . Fatigue 10/30/2019  . History of COVID-19 08/2019  . Hypothyroidism 04/16/2019  . Internal hemorrhoids 08/21/2018  . Acute pericarditis   . Globus sensation   . Essential hypertension   . Empty sella syndrome   . Chest pain with low risk for cardiac etiology 12/22/2015    Larrie Fraizer, [place], [place], [facility] 08/24/2020, 10:37 PM  [facility] [address] [place], [place], [address] Phone: [phone]   Fax:  [phone]  Name: Brylee Mcgreal MRN: [id number] Date of Birth: 07/15/1970

## 2020-09-18 ENCOUNTER — Other Ambulatory Visit: Payer: Self-pay | Admitting: Cardiovascular Disease

## 2021-11-01 IMAGING — CT CT ORBITS W/O CM
1 series · 16 of 30 positions shown, 20 images · non-contrast
Comparison: None.

CLINICAL DATA: Injury on 07/15/2019 with possible metal in the left
eye by following accident at work. Persistent irritation.

EXAM:
CT ORBITS WITHOUT CONTRAST
TECHNIQUE: Multidetector CT images were obtained using the standard protocol
without intravenous contrast.

[Series 4: orbits-soft · axial · 0.37mm/px · z∈[-146,-66]mm · 16 of 44 slices shown, 20 images]
[im 2/44  brain]
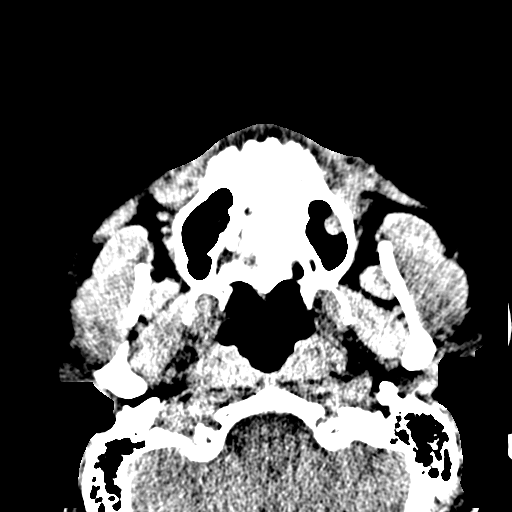
[im 2/44  bone]
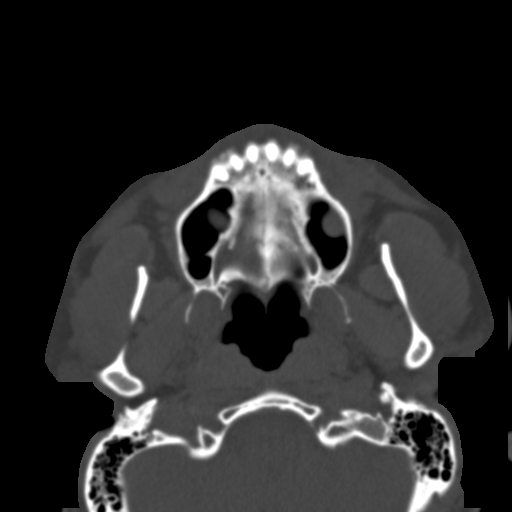
[im 5/44  bone]
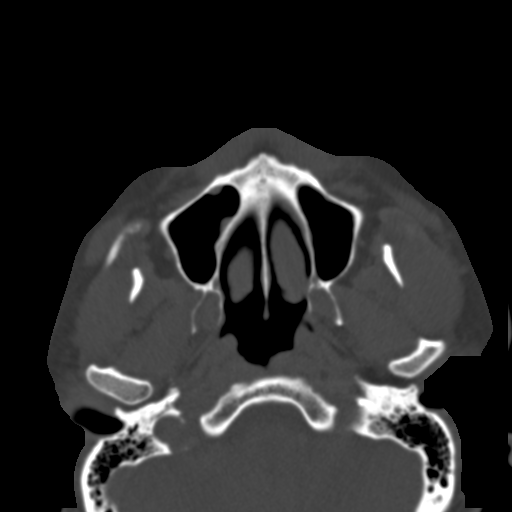
[im 8/44  bone]
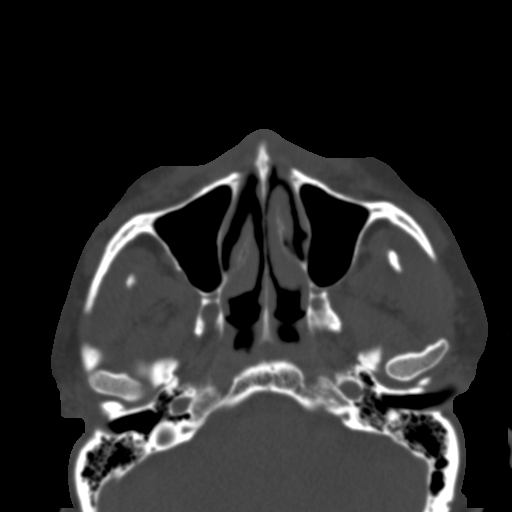
[im 11/44  bone]
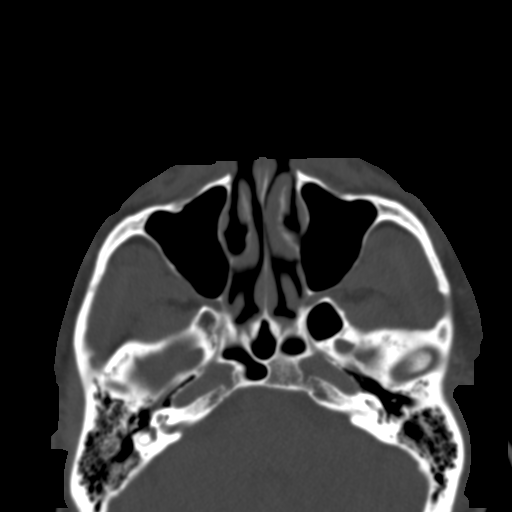
[im 12/44  brain]
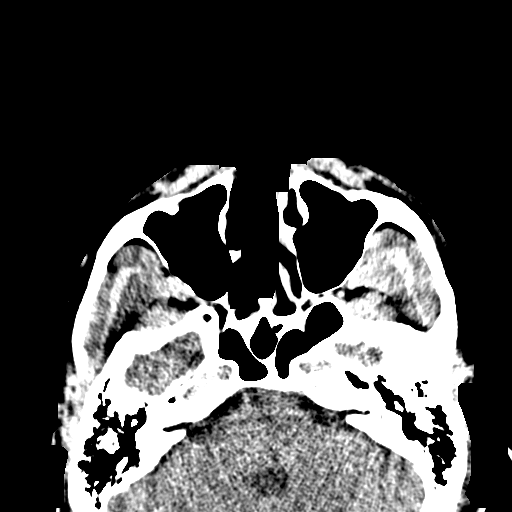
[im 12/44  bone]
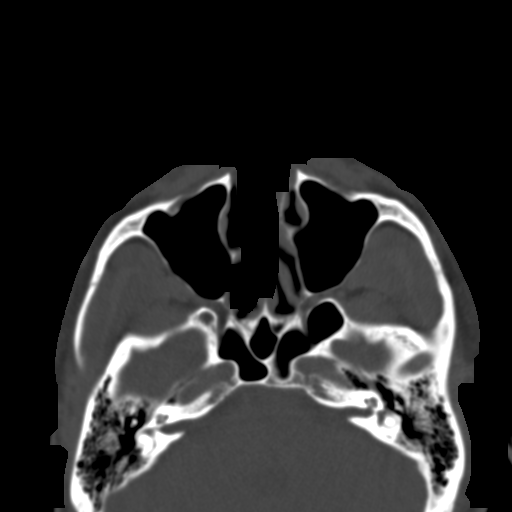
[im 15/44  bone]
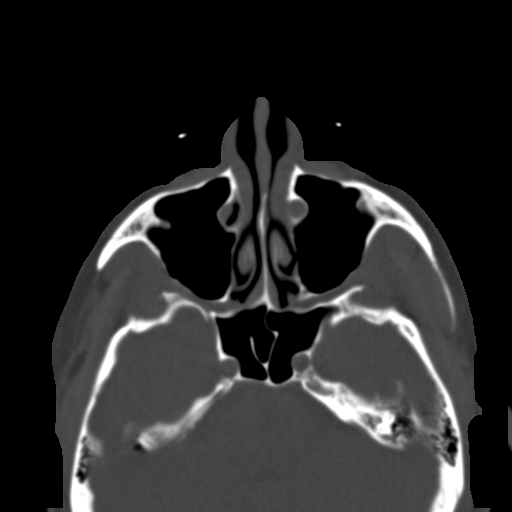
[im 18/44  bone]
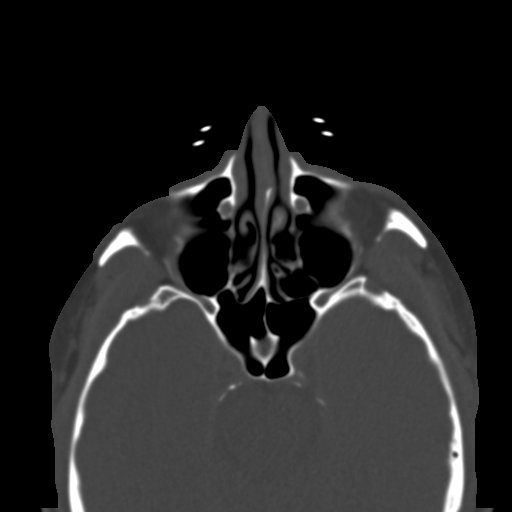
[im 21/44  bone]
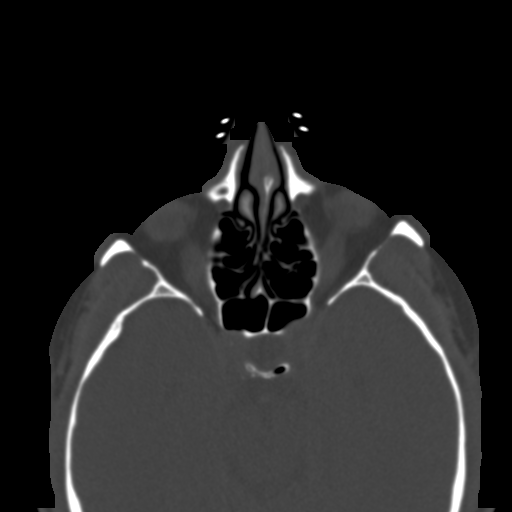
[im 23/44  brain]
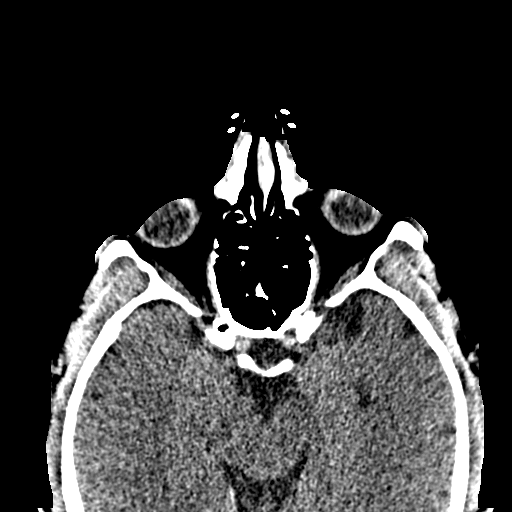
[im 23/44  bone]
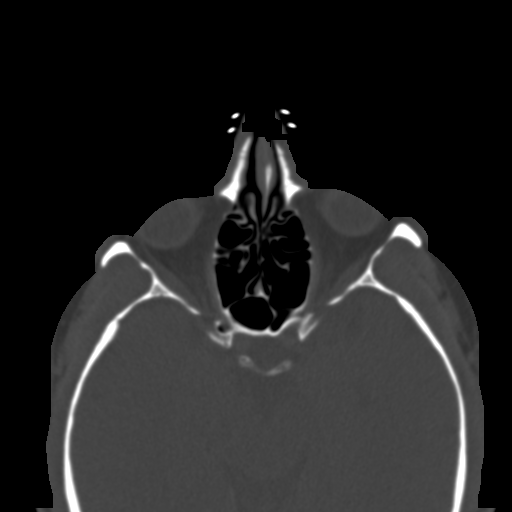
[im 26/44  bone]
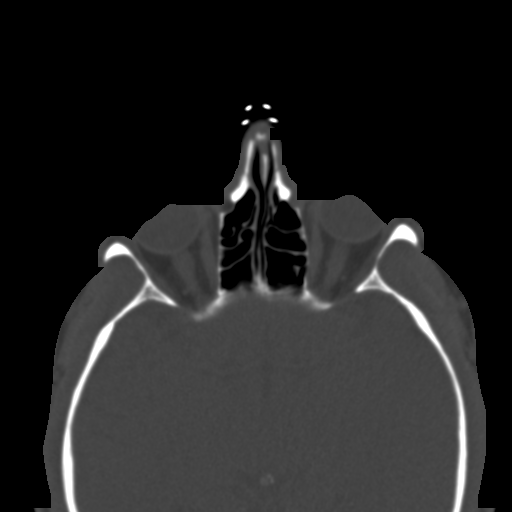
[im 29/44  bone]
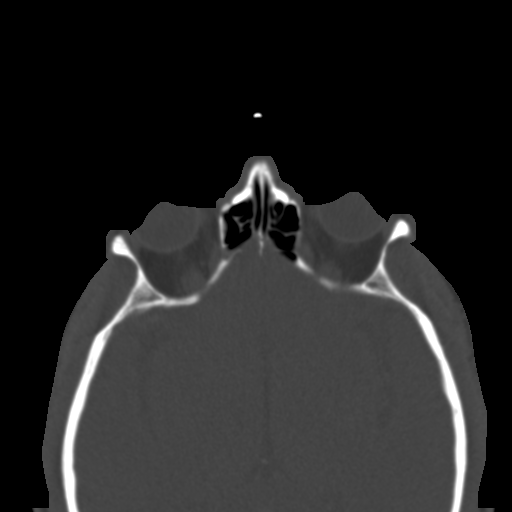
[im 32/44  bone]
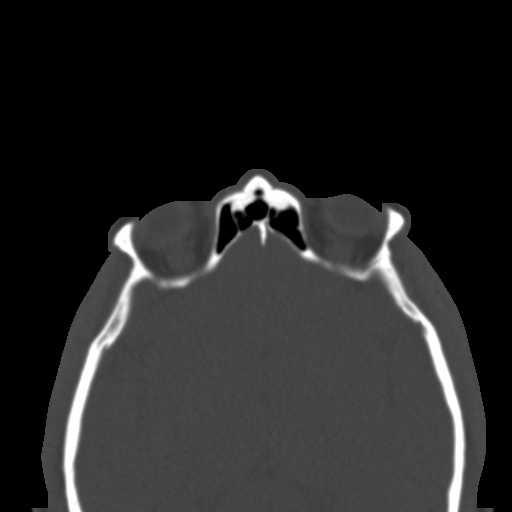
[im 33/44  brain]
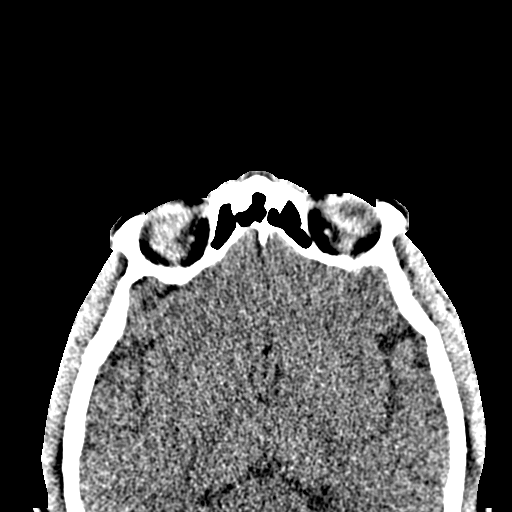
[im 33/44  bone]
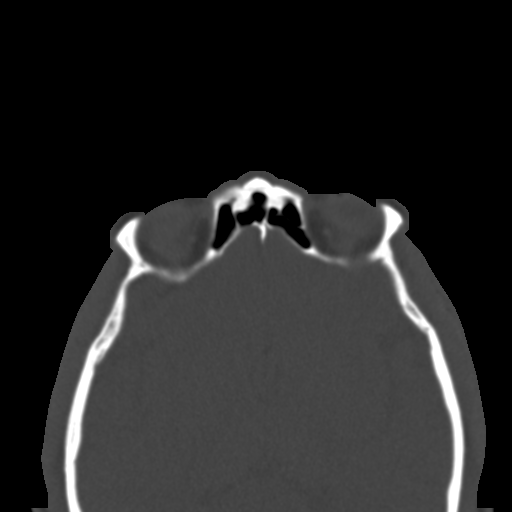
[im 36/44  bone]
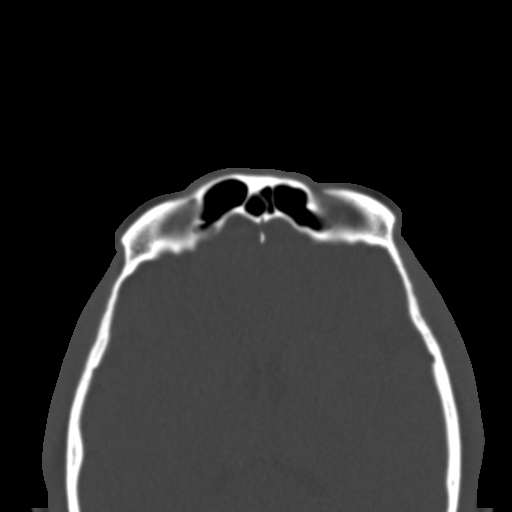
[im 39/44  bone]
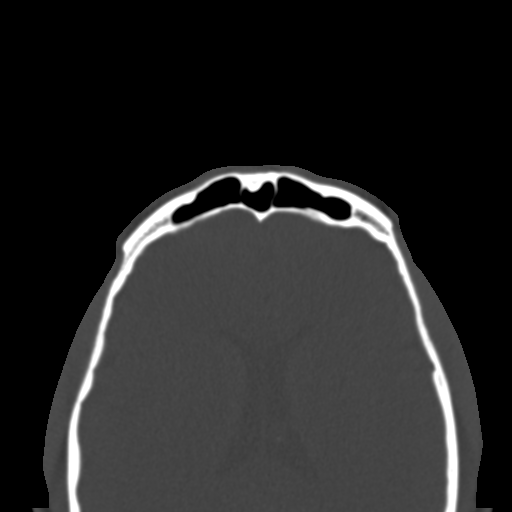
[im 42/44  bone]
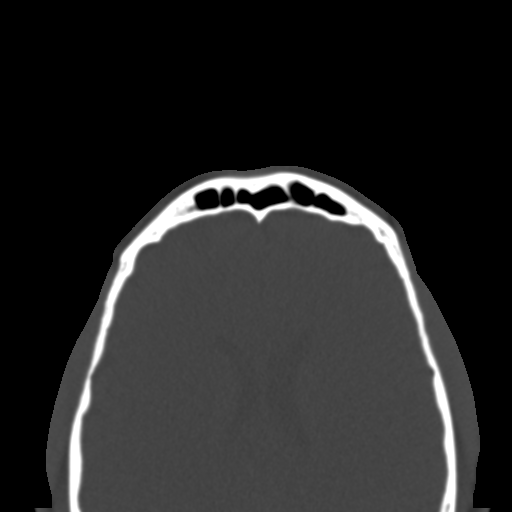

[16 of 30 positions shown; findings below may reference images not displayed]

FINDINGS: Orbits: Both globes appear normal. Optic nerves are normal. Orbital
fat is normal. Extra-ocular muscles are normal. Lacrimal glands are
normal. No radiodense foreign object. No sign of soft tissue
inflammatory disease.

Visualized sinuses: Clear and normal

Soft tissues: Otherwise negative

Limited intracranial: Normal
IMPRESSION: Negative examination. No evidence of radiodense foreign object. No
gross inflammatory change visible by CT.

## 2022-01-05 ENCOUNTER — Other Ambulatory Visit: Payer: Self-pay | Admitting: Cardiovascular Disease

## 2022-03-01 ENCOUNTER — Other Ambulatory Visit: Payer: Self-pay | Admitting: Cardiovascular Disease

## 2022-03-11 ENCOUNTER — Other Ambulatory Visit: Payer: Self-pay | Admitting: Cardiovascular Disease

## 2022-06-10 ENCOUNTER — Telehealth: Payer: Self-pay | Admitting: Home Health

## 2022-06-10 ENCOUNTER — Other Ambulatory Visit: Payer: Self-pay | Admitting: Home Health

## 2022-06-10 MED ORDER — METOPROLOL TARTRATE 25 MG PO TABS: 25.0000 mg | ORAL_TABLET | Freq: Two times a day (BID) | ORAL | 0 refills | Status: DC

## 2022-06-10 NOTE — Telephone Encounter (Signed)
Patient called after hour line, reporting metoprolol 25mg  BID is out of refill, has not been seen since 2021, tolerating medication without issue, got too busy, willing to come in for follow up, appt arranged with APP on 06/21/22, will provide 30 days refill x1.

## 2022-06-12 NOTE — Progress Notes (Signed)
Cardiology Office Note:    Date:  06/20/2022   ID:  Michael Warren, DOB 08/26/1970, MRN [id number]  PCP:  Gweneth Dimitri, MD  [facility] Providers Cardiologist:  Kristeen Miss, MD     Referring MD: Gweneth Dimitri, MD   Chief Complaint:  Follow-up     History of Present Illness:   Michael Warren is a 51 y.o. 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 Elease Hashimoto 01/2020 and was referred to long covid clinic in [place].  Patient called in 06/10/22 an needed refill on metoprolol. He was given this appt and 30 day supply.   Patient comes in for f/u. Had PT for long covid and it seemed to help. HR still goes up to 130/m sitting still if he doesn't take metoprolol. No regular exercise since going back to work full time. He still fatigues easily. Can do light workouts, yoga. Denies chest pain, dyspnea, dizziness, presyncope.     Past Medical History:  Diagnosis Date   Acute pericarditis    Brain fog    stemming from COVID-19   Empty sella syndrome    partial; on hormone replacement   Esophageal ulcer    Essential hypertension    Fatigue 10/30/2019   Generalized anxiety disorder    History of COVID-19 08/2019   Hypothyroidism 04/16/2019   Internal hemorrhoids 08/21/2018   Major depressive disorder    Muscle fasciculation 12/22/2019   Muscle weakness    Obstructive sleep apnea    confirmed via sleep test; no current CPAP use   Ringing in ear    Current Medications: Current Meds  Medication Sig   amLODipine (NORVASC) 5 MG tablet Take 5 mg by mouth daily.   B-D 3CC LUER-LOK SYR 21GX1-1/2 21G X 1-1/2" 3 ML MISC AS DIRECTED FOR USE WITH TESTOSTERONE IM   calcium carbonate (TUMS - DOSED IN MG ELEMENTAL CALCIUM) 500 MG chewable tablet Chew 2 tablets by mouth 2 (two) times daily as needed for indigestion or heartburn.   Cholecalciferol (VITAMIN D3) 5000 UNITS CAPS Take 5,000 Units by mouth daily.   FLOVENT  HFA 110 MCG/ACT inhaler Inhale 2 puffs into the lungs 2 (two) times daily.   hydrochlorothiazide (HYDRODIURIL) 25 MG tablet Take 25 mg by mouth daily.   ibuprofen (ADVIL) 200 MG tablet Take 200 mg by mouth as needed for moderate pain.   levothyroxine (SYNTHROID, LEVOTHROID) 125 MCG tablet Take 125 mcg by mouth daily.   liothyronine (CYTOMEL) 5 MCG tablet Take 5 mcg by mouth daily.   methylphenidate ([name]) 10 MG tablet Take 10 mg by mouth 2 (two) times daily.   oxymetazoline (AFRIN) 0.05 % nasal spray Place 1 spray into both nostrils daily as needed for congestion.   tadalafil (CIALIS) 20 MG tablet Take 20 mg by mouth daily as needed for erectile dysfunction.   testosterone cypionate (DEPOTESTOTERONE CYPIONATE) 200 MG/ML injection Inject 0.6 mg into the muscle once a week.   valACYclovir (VALTREX) 1000 MG tablet Take 1 tablet by mouth as needed.   VENTOLIN HFA 108 (90 BASE) MCG/ACT inhaler Inhale 2 puffs into the lungs daily as needed for wheezing or shortness of breath.    [DISCONTINUED] metoprolol tartrate (LOPRESSOR) 25 MG tablet Take 1 tablet (25 mg total) by mouth 2 (two) times daily. Please schedule appointment for future refills. 2nd attempt. thank you   [DISCONTINUED] omeprazole (PRILOSEC) 40 MG capsule Take 40 mg by mouth  daily.    Allergies:   Patient has no allergy information on record.   Social History   Tobacco Use   Smoking status: Never   Smokeless tobacco: Former    Types: Snuff    Quit date: 2017  Substance Use Topics   Alcohol use: Yes    Alcohol/week: 7.0 - 21.0 standard drinks of alcohol    Types: 7 - 21 Cans of beer per week    Comment: 1-3 beers per day   Drug use: Never    Family Hx: The patient's family history includes Coronary artery disease in his maternal grandfather and maternal grandmother; Goiter in his mother; Heart disease in his maternal grandfather, maternal grandmother, and paternal grandfather; Other in his father and mother.  ROS      Physical Exam:    VS:  BP 130/82 (BP Location: Left Arm, Patient Position: Sitting)   Pulse 71   Ht 5\' 9"  (1.753 m)   Wt 192 lb 8 oz (87.3 kg)   SpO2 95%   BMI 28.43 kg/m     Wt Readings from Last 3 Encounters:  06/20/22 192 lb 8 oz (87.3 kg)  01/26/20 194 lb (88 kg)  12/22/19 193 lb (87.5 kg)    Physical Exam  GEN: Thin, in no acute distress  Neck: no JVD, carotid bruits, or masses Cardiac:RRR; no murmurs, rubs, or gallops  Respiratory:  clear to auscultation bilaterally, normal work of breathing GI: soft, nontender, nondistended, + BS Ext: without cyanosis, clubbing, or edema, Good distal pulses bilaterally Neuro:  Alert and Oriented x 3,  Psych: euthymic mood, full affect        EKGs/Labs/Other Test Reviewed:    EKG:  EKG is  ordered today.  The ekg ordered today demonstrates NSR normal EKG  Recent Labs: No results found for requested labs within last 365 days.   Recent Lipid Panel No results for input(s): "CHOL", "TRIG", "HDL", "VLDL", "LDLCALC", "LDLDIRECT" in the last 8760 hours.   Prior CV Studies:   Echo 10/2019  IMPRESSIONS     1. Global longitudinal strain is -18.7%. Left ventricular ejection  fraction, by estimation, is 55%%. The left ventricle has low normal  function. The left ventricle has no regional wall motion abnormalities.  There is mild left ventricular hypertrophy.  Left ventricular diastolic parameters were normal.   2. Right ventricular systolic function is normal. The right ventricular  size is normal.   3. The mitral valve is normal in structure. Trivial mitral valve  regurgitation.   4. The aortic valve is normal in structure. Aortic valve regurgitation is  not visualized.   5. The inferior vena cava is normal in size with greater than 50%  respiratory variability, suggesting right atrial pressure of 3 mmHg.   Comparison(s): The left ventricular function is unchanged.  Risk Assessment/Calculations/Metrics:               ASSESSMENT & PLAN:   No problem-specific Assessment & Plan notes found for this encounter.   Prior history of pericarditis-no recurrence  HTN controlled on metoprolol, amlodipine, HCTZ-labs reviewed 11/2021 on [facility] and are normal.  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   Medication Adjustments/Labs and Tests Ordered: Current medicines are reviewed at length with the patient today.  Concerns regarding medicines are outlined above.  Tests Ordered: Orders Placed This Encounter  Procedures   EKG 12-Lead  Medication Changes: Meds ordered this encounter  Medications   metoprolol tartrate (LOPRESSOR) 25 MG tablet    Sig: Take 1 tablet (25 mg total) by mouth 2 (two) times daily.    Dispense:  180 tablet    Refill:  3    Requesting 1 year supply   Signed, Jacolyn Reedy, PA-C  06/20/2022 2:29 PM    [facility] [address] [place], [place], [place]  [address] Phone: [phone]; Fax: [phone]

## 2022-06-20 ENCOUNTER — Encounter: Payer: Self-pay | Admitting: Physician Assistant

## 2022-06-20 ENCOUNTER — Ambulatory Visit: Payer: 59 | Attending: Physician Assistant | Admitting: Physician Assistant

## 2022-06-20 VITALS — BP 130/82 | HR 71 | Ht 69.0 in | Wt 192.5 lb

## 2022-06-20 DIAGNOSIS — Z8679 Personal history of other diseases of the circulatory system: Secondary | ICD-10-CM

## 2022-06-20 DIAGNOSIS — R000 Tachycardia, unspecified: Secondary | ICD-10-CM | POA: Diagnosis not present

## 2022-06-20 DIAGNOSIS — I10 Essential (primary) hypertension: Secondary | ICD-10-CM | POA: Diagnosis not present

## 2022-06-20 MED ORDER — METOPROLOL TARTRATE 25 MG PO TABS: 25.0000 mg | ORAL_TABLET | Freq: Two times a day (BID) | ORAL | 3 refills | Status: AC

## 2022-06-20 NOTE — Patient Instructions (Signed)
Medication Instructions:  Your physician recommends that you continue on your current medications as directed. Please refer to the Current Medication list given to you today.    *If you need a refill on your cardiac medications before your next appointment, please call your pharmacy*   Lab Work:  If you have labs (blood work) drawn today and your tests are completely normal, you will receive your results only by: MyChart Message (if you have MyChart) OR A paper copy in the mail If you have any lab test that is abnormal or we need to change your treatment, we will call you to review the results.   Testing/Procedures: None ordered    Follow-Up: At [facility], you and your health needs are our priority.  As part of our continuing mission to provide you with exceptional heart care, we have created designated Provider Care Teams.  These Care Teams include your primary Cardiologist (physician) and Advanced Practice Providers (APPs -  Physician Assistants and Nurse Practitioners) who all work together to provide you with the care you need, when you need it.  We recommend signing up for the patient portal called "MyChart".  Sign up information is provided on this After Visit Summary.  MyChart is used to connect with patients for Virtual Visits (Telemedicine).  Patients are able to view lab/test results, encounter notes, upcoming appointments, etc.  Non-urgent messages can be sent to your provider as well.   To learn more about what you can do with MyChart, go to [email or web address].    Your next appointment:   12 month(s)  The format for your next appointment:   In Person  Provider:   Kristeen Miss, MD     Other Instructions Your physician recommends that you get 150 minutes of exercise a week

## 2023-08-02 ENCOUNTER — Other Ambulatory Visit (HOSPITAL_BASED_OUTPATIENT_CLINIC_OR_DEPARTMENT_OTHER): Payer: Self-pay | Admitting: Family Medicine

## 2023-08-02 DIAGNOSIS — E782 Mixed hyperlipidemia: Secondary | ICD-10-CM

## 2023-08-07 ENCOUNTER — Ambulatory Visit (HOSPITAL_COMMUNITY)
Admission: RE | Admit: 2023-08-07 | Discharge: 2023-08-07 | Disposition: A | Discharge status: Home / Self Care | Payer: 59 | Source: Ambulatory Visit | Attending: Family Medicine | Admitting: Family Medicine

## 2023-08-07 DIAGNOSIS — E782 Mixed hyperlipidemia: Secondary | ICD-10-CM | POA: Insufficient documentation

## 2023-11-26 ENCOUNTER — Encounter: Payer: Self-pay | Admitting: Gastroenterology

## 2024-02-18 ENCOUNTER — Ambulatory Visit: Admitting: Gastroenterology

## 2024-02-18 ENCOUNTER — Encounter: Payer: Self-pay | Admitting: Gastroenterology

## 2024-02-18 VITALS — BP 104/70 | HR 86 | Ht 69.0 in | Wt 207.2 lb

## 2024-02-18 DIAGNOSIS — R1310 Dysphagia, unspecified: Secondary | ICD-10-CM | POA: Diagnosis not present

## 2024-02-18 DIAGNOSIS — K219 Gastro-esophageal reflux disease without esophagitis: Secondary | ICD-10-CM | POA: Diagnosis not present

## 2024-02-18 DIAGNOSIS — Z79899 Other long term (current) drug therapy: Secondary | ICD-10-CM

## 2024-02-18 MED ORDER — OMEPRAZOLE 40 MG PO CPDR: DELAYED_RELEASE_CAPSULE | ORAL | 3 refills | Status: DC

## 2024-02-18 NOTE — Patient Instructions (Addendum)
 You have been scheduled for an endoscopy with Dr. Leigh on 9-15. Please follow written instructions given to you at your visit today.  If you use inhalers (even only as needed), please bring them with you on the day of your procedure.  If you take any of the following medications, they will need to be adjusted prior to your procedure:   DO NOT TAKE 7 DAYS PRIOR TO TEST- Trulicity (dulaglutide) Ozempic, Wegovy (semaglutide) Mounjaro (tirzepatide) Bydureon Bcise (exanatide extended release)  DO NOT TAKE 1 DAY PRIOR TO YOUR TEST Rybelsus (semaglutide) Adlyxin (lixisenatide) Victoza (liraglutide) Byetta (exanatide) ___________________________________________________________________________   We have sent the following medications to your pharmacy for you to pick up at your convenience: Omeprazole  40 mg: Take once to twice a day as needed. Take 30 to 60 minutes before a meal  We will request records of your Colonoscopy in 2019 with Dr. Luis.  Thank you for entrusting me with your care and for choosing [facility], Dr. Elspeth Leigh    _______________________________________________________  If your blood pressure at your visit was 140/90 or greater, please contact your primary care physician to follow up on this.  _______________________________________________________  If you are age 30 or older, your body mass index should be between 23-30. Your Body mass index is 30.61 kg/m. If this is out of the aforementioned range listed, please consider follow up with your Primary Care Provider.  If you are age 71 or younger, your body mass index should be between 19-25. Your Body mass index is 30.61 kg/m. If this is out of the aformentioned range listed, please consider follow up with your Primary Care Provider.   ________________________________________________________  The [facility] providers would like to encourage you to use MYCHART to communicate with providers  for non-urgent requests or questions.  Due to long hold times on the telephone, sending your provider a message by [facility] may be a faster and more efficient way to get a response.  Please allow 48 business hours for a response.  Please remember that this is for non-urgent requests.  _______________________________________________________  [facility] is using a team-based approach to care.  Your team is made up of your doctor and two to three APPS. Our APPS (Nurse Practitioners and Physician Assistants) work with your physician to ensure care continuity for you. They are fully qualified to address your health concerns and develop a treatment plan. They communicate directly with your gastroenterologist to care for you. Seeing the Advanced Practice Practitioners on your physician's team can help you by facilitating care more promptly, often allowing for earlier appointments, access to diagnostic testing, procedures, and other specialty referrals.

## 2024-02-18 NOTE — Progress Notes (Signed)
 HPI :  53 year old male with a history of exercise-induced asthma, hemorrhoids, GERD, here to establish care for symptoms of dysphagia, GERD.  Previously seen by Dr. Luis who has since retired, PCP is Sari Pay MD.  He states post COVID around 2021 he developed some periodic dysphagia as well as some odynophagia when it happened.  He has had this intermittently for some time although seem to improve.  About a year ago he states GERD started bothering him more frequently.  He endorses nocturnal pyrosis and discomfort that can sometimes radiate into his back that can bother him.  He gets a burning sensation in his mid upper back sometimes when he eats or has reflux.  Over time his dysphagia appears to have persisted and gotten worse recently.  He states he feels typically solids can get hung up in his lower chest and occasionally that can occur with liquids as well.  It was usually every time he ate for the past few months this would occur however it has been slightly less bothersome lately.  He has taken some measures on his own to sleep with the head of his bed elevated to reduce nocturnal reflux which she thinks has helped.  He has started omeprazole  typically 20 mg once daily.  That has definitely helped his reflux/heartburn and symptoms however has not resolved it.  He has taken it a few times dosed at twice daily, still has significant nocturnal symptoms that bother him.  He typically eats dinner around 530 or 6 PM and will lie down at bed about 11 to 11:30 PM.  He thinks he did have an EGD perhaps back in 2017 or so but does not recall the results, he denies having any dilation and thinks it was normal.  He is also had a colonoscopy in recent years, he thinks it was normal and told to repeat in 10 years.  No records on file.  He has had internal hemorrhoids that been banded in the past and he states it helps.  Of note he does endorse a history of CKD, note recent labs on file, last creatinine  in the system was from May 2021, creatinine of 1.42.  He states his kidney function has fluctuated over years and followed by his PCP  Of note he recently had a cardiac CT as below which was normal  Cardiac CT 08/07/23: IMPRESSION: Coronary calcium  score of 0.  IMPRESSION: No acute or unexpected extracardiac findings.   Past Medical History:  Diagnosis Date   Acute pericarditis    Asthma    Brain fog    stemming from COVID-19   Empty sella syndrome    partial; on hormone replacement   Esophageal ulcer    Essential hypertension    Fatigue 10/30/2019   Generalized anxiety disorder    History of COVID-19 08/2019   Hypothyroidism 04/16/2019   Internal hemorrhoids 08/21/2018   Major depressive disorder    Muscle fasciculation 12/22/2019   Muscle weakness    Obstructive sleep apnea    confirmed via sleep test; no current CPAP use   Ringing in ear      Past Surgical History:  Procedure Laterality Date   COLONOSCOPY     URETEROSCOPY     Family History  Problem Relation Age of Onset   Goiter Mother    Other Mother        hx of pituitary tumor   Other Father        mitochrondrial syndrome  Heart disease Maternal Grandmother    Coronary artery disease Maternal Grandmother    Heart disease Maternal Grandfather    Coronary artery disease Maternal Grandfather    Heart disease Paternal Grandfather    Social History   Tobacco Use   Smoking status: Never   Smokeless tobacco: Former    Types: Snuff    Quit date: 2017  Substance Use Topics   Alcohol use: Yes    Alcohol/week: 7.0 - 21.0 standard drinks of alcohol    Types: 7 - 21 Cans of beer per week    Comment: 1-3 beers per day   Drug use: Never   Current Outpatient Medications  Medication Sig Dispense Refill   amLODipine  (NORVASC ) 5 MG tablet Take 5 mg by mouth daily.     B-D 3CC LUER-LOK SYR 21GX1-1/2 21G X 1-1/2 3 ML MISC AS DIRECTED FOR USE WITH TESTOSTERONE  IM  5   calcium  carbonate (TUMS - DOSED IN MG  ELEMENTAL CALCIUM ) 500 MG chewable tablet Chew 2 tablets by mouth 2 (two) times daily as needed for indigestion or heartburn.     Cholecalciferol (VITAMIN D3) 5000 UNITS CAPS Take 5,000 Units by mouth daily.     FLOVENT HFA 110 MCG/ACT inhaler Inhale 2 puffs into the lungs 2 (two) times daily.     hydrochlorothiazide (HYDRODIURIL) 25 MG tablet Take 25 mg by mouth daily.     ibuprofen  (ADVIL ) 200 MG tablet Take 200 mg by mouth as needed for moderate pain.     levothyroxine  (SYNTHROID , LEVOTHROID) 125 MCG tablet Take 125 mcg by mouth daily.     liothyronine  (CYTOMEL ) 5 MCG tablet Take 5 mcg by mouth daily.     melatonin 5 MG TABS 1 tablet in the evening Orally Once a day As needed     methylphenidate ([name]) 10 MG tablet Take 10 mg by mouth 2 (two) times daily.     metoprolol  tartrate (LOPRESSOR ) 25 MG tablet Take 1 tablet (25 mg total) by mouth 2 (two) times daily. 180 tablet 3   omeprazole  (PRILOSEC) 40 MG capsule as needed.     oxymetazoline (AFRIN) 0.05 % nasal spray Place 1 spray into both nostrils daily as needed for congestion.     sildenafil (REVATIO) 20 MG tablet Take 20 mg by mouth 3 (three) times daily.     tadalafil (CIALIS) 20 MG tablet Take 20 mg by mouth daily as needed for erectile dysfunction.     testosterone  cypionate (DEPOTESTOTERONE CYPIONATE) 200 MG/ML injection Inject 0.6 mg into the muscle once a week.     valACYclovir (VALTREX) 1000 MG tablet Take 1 tablet by mouth as needed.     VENTOLIN  HFA 108 (90 BASE) MCG/ACT inhaler Inhale 2 puffs into the lungs daily as needed for wheezing or shortness of breath.      No current facility-administered medications for this visit.   Not on File   Review of Systems: All systems reviewed and negative except where noted in HPI.   No recent labs on file in Epic / Care everywhere   Physical Exam: BP 104/70 (BP Location: Left Arm, Patient Position: Sitting, Cuff Size: Normal)   Pulse 86   Ht 5' 9 (1.753 m) Comment: height  without shoes  Wt 207 lb 4 oz (94 kg)   BMI 30.61 kg/m  Constitutional: Pleasant,well-developed, male in no acute distress. HEENT: Normocephalic and atraumatic. Conjunctivae are normal. No scleral icterus. Neck supple.  Cardiovascular: Normal rate, regular rhythm.  Pulmonary/chest: Effort normal and breath  sounds normal. No wheezing, rales or rhonchi. Abdominal: Soft, nondistended, nontender.  There are no masses palpable. No hepatomegaly. Extremities: no edema Lymphadenopathy: No cervical adenopathy noted. Neurological: Alert and oriented to person place and time. Skin: Skin is warm and dry. No rashes noted. Psychiatric: Normal mood and affect. Behavior is normal.   ASSESSMENT: 53 y.o. male here for assessment of the following  1. Dysphagia, unspecified type   2. Gastroesophageal reflux disease, unspecified whether esophagitis present   3. Long-term current use of proton pump inhibitor therapy    As above, ongoing dysphagia which has worsened in recent months, mostly solids but can occur to liquids as well.  He also has significant reflux symptoms that bother him, including nocturnal symptoms.  Recently started low-dose PPI which helps but has not resolved his symptoms.  We discussed DDx.  Based on his symptoms I suspect he may have peptic stricture related to GERD although we discussed other etiologies.  He does have exercise-induced asthma, EOE needs to be ruled out as well.  Recommending EGD to further evaluate and treat stricture if present with dilation.  We discussed what this entails, risks and benefits of that and anesthesia, and he wants to proceed.  In the interim, I recommend we escalate his PPI dosing given it has helped but not resolved the symptoms.  Recommend omeprazole  40 mg once daily, to be taken 30 to 60 minutes prior to meals.  If still in complete resolution of his reflux with this dosing can increase to twice daily if needed.  We did discuss long-term risks of chronic  PPI use.  For short-term course I hope this can help him feel better and control his symptoms better, however understanding with long-term use we need to be mindful of CKD and monitoring of his kidney function.  He understands this, his kidney function is monitored by PCP and will need to keep an eye on this over time if he remains on chronic PPI.  I think for a few week course certainly reasonable to try this to get him feeling better while we await endoscopy which is hopefully done within the next month or so.  Chew food well in the interim to help prevent impaction.  Otherwise, have requested to get records from prior EGD and colonoscopy to clarify findings and when he is next due for screening colonoscopy.  He agrees   PLAN: - schedule EGD at the [facility] with likely dilation - omeprazole  40mg  / day, can increase to BID if needed, 30-60 min prior to a meal - discussed long term use pf PPI and risks, want to use lowest dose needed. Will await EGD first. Does have CKD, needs to make sure PCP is aware and monitor closely - will attempt get report of prior EGD and colonoscopy from Dr. Marshell office  Marcey Naval, MD [facility]  CC: Aisha Harvey, MD

## 2024-02-28 ENCOUNTER — Telehealth: Payer: Self-pay | Admitting: Gastroenterology

## 2024-02-28 NOTE — Telephone Encounter (Signed)
 Colonoscopy records arrived:  Done by Dr. Luis - 04/23/2018 -excellent prep, normal ileum, normal colon, scarring from hemorrhoid banding's and internal hemorrhoids.  Repeat colonoscopy in 10 years   Jan can you please place recall for colonoscopy October 2029.  Thanks

## 2024-02-29 NOTE — Telephone Encounter (Signed)
 Colonoscopy recall placed for 04/2028.

## 2024-03-17 ENCOUNTER — Encounter: Payer: Self-pay | Admitting: Gastroenterology

## 2024-03-24 ENCOUNTER — Ambulatory Visit (AMBULATORY_SURGERY_CENTER): Admitting: Gastroenterology

## 2024-03-24 ENCOUNTER — Encounter: Payer: Self-pay | Admitting: Gastroenterology

## 2024-03-24 VITALS — BP 109/65 | HR 75 | Temp 97.9°F | Resp 18 | Ht 69.0 in | Wt 207.0 lb

## 2024-03-24 DIAGNOSIS — K2100 Gastro-esophageal reflux disease with esophagitis, without bleeding: Secondary | ICD-10-CM

## 2024-03-24 DIAGNOSIS — K2289 Other specified disease of esophagus: Secondary | ICD-10-CM

## 2024-03-24 DIAGNOSIS — K449 Diaphragmatic hernia without obstruction or gangrene: Secondary | ICD-10-CM

## 2024-03-24 DIAGNOSIS — R1310 Dysphagia, unspecified: Secondary | ICD-10-CM

## 2024-03-24 DIAGNOSIS — K219 Gastro-esophageal reflux disease without esophagitis: Secondary | ICD-10-CM

## 2024-03-24 MED ORDER — SODIUM CHLORIDE 0.9 % IV SOLN: 500.0000 mL | Freq: Once | INTRAVENOUS | Status: AC

## 2024-03-24 MED ORDER — OMEPRAZOLE 40 MG PO CPDR: 40.0000 mg | DELAYED_RELEASE_CAPSULE | Freq: Two times a day (BID) | ORAL | 3 refills | Status: DC

## 2024-03-24 NOTE — Progress Notes (Signed)
 [facility] History and Physical   Primary Care Physician:  Aisha Harvey, MD   Reason for Procedure:   Dysphagia, GERD  Plan:    EGD with possible dilation      HPI: Michael Warren is a 53 y.o. male  here for EGD for dysphagia / GERD to evaluate symptoms. Now on omeprazole  40mg  / day. It has helped somewhat but symptoms still persist.   Otherwise feels well without any cardiopulmonary symptoms.   I have discussed risks / benefits of anesthesia and endoscopic procedure with Michael Warren and they wish to proceed with the exams as outlined today.    Past Medical History:  Diagnosis Date   Acute pericarditis    Asthma    Brain fog    stemming from COVID-19   Empty sella syndrome    partial; on hormone replacement   Esophageal ulcer    Essential hypertension    Fatigue 10/30/2019   Generalized anxiety disorder    History of COVID-19 08/2019   Hypothyroidism 04/16/2019   Internal hemorrhoids 08/21/2018   Major depressive disorder    Muscle fasciculation 12/22/2019   Muscle weakness    Obstructive sleep apnea    confirmed via sleep test; no current CPAP use   Ringing in ear     Past Surgical History:  Procedure Laterality Date   COLONOSCOPY     URETEROSCOPY      Prior to Admission medications   Medication Sig Start Date End Date Taking? Authorizing Provider  amLODipine  (NORVASC ) 5 MG tablet Take 5 mg by mouth daily. 12/16/19  Yes [provider]  Cholecalciferol (VITAMIN D3) 5000 UNITS CAPS Take 5,000 Units by mouth daily.   Yes [provider]  hydrochlorothiazide (HYDRODIURIL) 25 MG tablet Take 25 mg by mouth daily. 12/02/19  Yes [provider]  levothyroxine  (SYNTHROID , LEVOTHROID) 125 MCG tablet Take 125 mcg by mouth daily.   Yes [provider]  liothyronine  (CYTOMEL ) 5 MCG tablet Take 5 mcg by mouth daily. 02/24/15  Yes [provider]  melatonin 5 MG TABS 1 tablet in the evening Orally Once a day As  needed   Yes [provider]  methylphenidate ([name]) 10 MG tablet Take 10 mg by mouth 2 (two) times daily.   Yes [provider]  metoprolol  tartrate (LOPRESSOR ) 25 MG tablet Take 1 tablet (25 mg total) by mouth 2 (two) times daily. 06/20/22  Yes Parthenia Olivia HERO, PA-C  omeprazole  (PRILOSEC) 40 MG capsule Take 1 capsule once to twice daily 30 minutes before a meal, as needed 02/18/24  Yes Ariyon Mittleman, Elspeth SQUIBB, MD  sildenafil (REVATIO) 20 MG tablet Take 20 mg by mouth 3 (three) times daily. 02/13/24  Yes [provider]  tadalafil (CIALIS) 20 MG tablet Take 20 mg by mouth daily as needed for erectile dysfunction.   Yes [provider]  testosterone  cypionate (DEPOTESTOTERONE CYPIONATE) 200 MG/ML injection Inject 0.6 mg into the muscle once a week.   Yes [provider]  B-D 3CC LUER-LOK SYR 21GX1-1/2 21G X 1-1/2 3 ML MISC AS DIRECTED FOR USE WITH TESTOSTERONE  IM 11/07/15   [provider]  calcium  carbonate (TUMS - DOSED IN MG ELEMENTAL CALCIUM ) 500 MG chewable tablet Chew 2 tablets by mouth 2 (two) times daily as needed for indigestion or heartburn.    [provider]  FLOVENT HFA 110 MCG/ACT inhaler Inhale 2 puffs into the lungs 2 (two) times daily. 10/20/19   [provider]  ibuprofen  (ADVIL ) 200 MG  tablet Take 200 mg by mouth as needed for moderate pain.    [provider]  oxymetazoline (AFRIN) 0.05 % nasal spray Place 1 spray into both nostrils daily as needed for congestion.    [provider]  valACYclovir (VALTREX) 1000 MG tablet Take 1 tablet by mouth as needed. 07/21/19   [provider]  VENTOLIN  HFA 108 (90 BASE) MCG/ACT inhaler Inhale 2 puffs into the lungs daily as needed for wheezing or shortness of breath.  03/16/15   [provider]    Current Outpatient Medications  Medication Sig Dispense Refill   amLODipine  (NORVASC ) 5 MG tablet Take 5 mg by mouth daily.     Cholecalciferol  (VITAMIN D3) 5000 UNITS CAPS Take 5,000 Units by mouth daily.     hydrochlorothiazide (HYDRODIURIL) 25 MG tablet Take 25 mg by mouth daily.     levothyroxine  (SYNTHROID , LEVOTHROID) 125 MCG tablet Take 125 mcg by mouth daily.     liothyronine  (CYTOMEL ) 5 MCG tablet Take 5 mcg by mouth daily.     melatonin 5 MG TABS 1 tablet in the evening Orally Once a day As needed     methylphenidate ([name]) 10 MG tablet Take 10 mg by mouth 2 (two) times daily.     metoprolol  tartrate (LOPRESSOR ) 25 MG tablet Take 1 tablet (25 mg total) by mouth 2 (two) times daily. 180 tablet 3   omeprazole  (PRILOSEC) 40 MG capsule Take 1 capsule once to twice daily 30 minutes before a meal, as needed 60 capsule 3   sildenafil (REVATIO) 20 MG tablet Take 20 mg by mouth 3 (three) times daily.     tadalafil (CIALIS) 20 MG tablet Take 20 mg by mouth daily as needed for erectile dysfunction.     testosterone  cypionate (DEPOTESTOTERONE CYPIONATE) 200 MG/ML injection Inject 0.6 mg into the muscle once a week.     B-D 3CC LUER-LOK SYR 21GX1-1/2 21G X 1-1/2 3 ML MISC AS DIRECTED FOR USE WITH TESTOSTERONE  IM  5   calcium  carbonate (TUMS - DOSED IN MG ELEMENTAL CALCIUM ) 500 MG chewable tablet Chew 2 tablets by mouth 2 (two) times daily as needed for indigestion or heartburn.     FLOVENT HFA 110 MCG/ACT inhaler Inhale 2 puffs into the lungs 2 (two) times daily.     ibuprofen  (ADVIL ) 200 MG tablet Take 200 mg by mouth as needed for moderate pain.     oxymetazoline (AFRIN) 0.05 % nasal spray Place 1 spray into both nostrils daily as needed for congestion.     valACYclovir (VALTREX) 1000 MG tablet Take 1 tablet by mouth as needed.     VENTOLIN  HFA 108 (90 BASE) MCG/ACT inhaler Inhale 2 puffs into the lungs daily as needed for wheezing or shortness of breath.      Current Facility-Administered Medications  Medication Dose Route Frequency Provider Last Rate Last Admin   0.9 %  sodium chloride  infusion  500 mL Intravenous Once Khaleef Ruby,  Elspeth SQUIBB, MD        Allergies as of 03/24/2024   (Not on File)    Family History  Problem Relation Age of Onset   Goiter Mother    Other Mother        hx of pituitary tumor   Other Father        mitochrondrial syndrome   Heart disease Maternal Grandmother    Coronary artery disease Maternal Grandmother    Heart disease Maternal Grandfather    Coronary artery disease Maternal Grandfather  Heart disease Paternal Grandfather    Colon cancer Neg Hx    Esophageal cancer Neg Hx    Prostate cancer Neg Hx    Rectal cancer Neg Hx     Social History   Socioeconomic History   Marital status: Married    Spouse name: Not on file   Number of children: 2   Years of education: 16   Highest education level: Bachelor's degree (e.g., BA, AB, BS)  Occupational History   Occupation: sheriff's deputy  Tobacco Use   Smoking status: Never   Smokeless tobacco: Former    Types: Snuff    Quit date: 2017  Substance and Sexual Activity   Alcohol use: Yes    Alcohol/week: 7.0 - 21.0 standard drinks of alcohol    Types: 7 - 21 Cans of beer per week    Comment: 1-3 beers per day   Drug use: Never   Sexual activity: Not on file  Other Topics Concern   Not on file  Social History Narrative   Lives at home with his family.   Right-handed.   3 cups caffeine per day.   Social Drivers of Corporate investment banker Strain: Not on file  Food Insecurity: Not on file  Transportation Needs: Not on file  Physical Activity: Not on file  Stress: Not on file  Social Connections: Not on file  Intimate Partner Violence: Not on file    Review of Systems: All other review of systems negative except as mentioned in the HPI.  Physical Exam: Vital signs BP 118/67   Pulse 74   Temp 97.9 F (36.6 C)   Ht 5' 9 (1.753 m)   Wt 207 lb (93.9 kg)   SpO2 96%   BMI 30.57 kg/m   General:   Alert,  Well-developed, pleasant and cooperative in NAD Lungs:  Clear throughout to auscultation.   Heart:   Regular rate and rhythm Abdomen:  Soft, nontender and nondistended.   Neuro/Psych:  Alert and cooperative. Normal mood and affect. A and O x 3  Marcey Naval, MD [facility]

## 2024-03-24 NOTE — Progress Notes (Signed)
 Vss nad trans to pacu

## 2024-03-24 NOTE — Op Note (Signed)
 [facility] Patient Name: Michael Warren Procedure Date: 03/24/2024 9:34 AM MRN: [id number] Endoscopist: Elspeth P. Leigh , MD, [id number] Age: 53 Referring MD:  Date of Birth: [birth year] Gender: Male Account #: [id number] Procedure:                Upper GI endoscopy Indications:              Dysphagia, follow-up of gastro-esophageal reflux                            disease - recently escalated omeprazole  from 20mg  /                            day to twice daily with some improvement in                            symptoms. Medicines:                Monitored Anesthesia Care Procedure:                Pre-Anesthesia Assessment:                           - Prior to the procedure, a History and Physical                            was performed, and patient medications and                            allergies were reviewed. The patient's tolerance of                            previous anesthesia was also reviewed. The risks                            and benefits of the procedure and the sedation                            options and risks were discussed with the patient.                            All questions were answered, and informed consent                            was obtained. Prior Anticoagulants: The patient has                            taken no anticoagulant or antiplatelet agents. ASA                            Grade Assessment: III - A patient with severe                            systemic disease. After reviewing the risks and  benefits, the patient was deemed in satisfactory                            condition to undergo the procedure.                           After obtaining informed consent, the endoscope was                            passed under direct vision. Throughout the                            procedure, the patient's blood pressure, pulse, and                            oxygen saturations were monitored  continuously. The                            GIF [id number] was introduced through the                            mouth, and advanced to the second part of duodenum.                            The upper GI endoscopy was accomplished without                            difficulty. The patient tolerated the procedure                            well. Scope In: Scope Out: Findings:                 Esophagogastric landmarks were identified: the                            Z-line was found at 39 cm, the gastroesophageal                            junction was found at 39 cm and the upper extent of                            the gastric folds was found at 42 cm from the                            incisors. Significant laxity at the GEJ, stomach                            did not retain air well to accomodate retroflexed                            views of the cardia.  The Z-line was irregular but did not meet criteria                            for Barrett's.                           A 3 cm hiatal hernia was present.                           LA Grade C esophagitis with no bleeding was found                            from 30 cm from the incisors to the GEJ - it                            appeared to be healing / resolving on higher dose                            but ulcerations / erosions still noted. Biopsies                            were taken with a cold forceps for histology -                            assess for underlying BE.                           No obvious stenosis / stricture noted. The exam of                            the esophagus was otherwise normal. Empiric                            dilation not performed given esophagitis noted and                            interval improvement on PPI.                           The entire examined stomach was normal. As above,                            poor air retention of the stomach due to hiatal                             hernia, retroflexed views of the cardia were not                            possible.                           The examined duodenum was normal. Complications:            No immediate complications. Estimated blood loss:  Minimal. Estimated Blood Loss:     Estimated blood loss was minimal. Impression:               - Esophagogastric landmarks identified.                           - Z-line irregular.                           - 3 cm hiatal hernia.                           - Long segment of LA Grade C esophagitis with no                            bleeding. Biopsied.                           - Normal stomach, poor air retention prohibited                            retroflexed views of the carda.                           - Normal examined duodenum.                           Overall, significant reflux changes, appears to be                            healing on higher dose PPI but not resolved. At                            risk for Barrett's, biopsies taken. No empiric                            dilation performed given esophagitis. Recommendation:           - Patient has a contact number available for                            emergencies. The signs and symptoms of potential                            delayed complications were discussed with the                            patient. Return to normal activities tomorrow.                            Written discharge instructions were provided to the                            patient.                           - Resume previous diet.                           -  Continue present medications.                           - Increase omeprazole  to 40mg  twice daily every                            day, take 30 to 60 minutes prior to a meal                           - Await pathology results.                           - Repeat EGD in 2-3 months, ensure mucosal healing,                             rule out Barrett's Kadee Philyaw P. Brinton Brandel, MD 03/24/2024 10:08:30 AM This report has been signed electronically.

## 2024-03-24 NOTE — Patient Instructions (Signed)

## 2024-03-24 NOTE — Progress Notes (Signed)
 Called to room to assist during endoscopic procedure.  Patient ID and intended procedure confirmed with present staff. Received instructions for my participation in the procedure from the performing physician.

## 2024-03-24 NOTE — Progress Notes (Signed)
 Pt's states no medical or surgical changes since previsit or office visit.

## 2024-03-25 ENCOUNTER — Telehealth: Payer: Self-pay | Admitting: *Deleted

## 2024-03-25 NOTE — Telephone Encounter (Signed)
 Left message on f/u call

## 2024-03-26 LAB — SURGICAL PATHOLOGY

## 2024-03-30 ENCOUNTER — Ambulatory Visit: Payer: Self-pay | Admitting: Gastroenterology

## 2024-04-08 ENCOUNTER — Other Ambulatory Visit: Payer: Self-pay | Admitting: Gastroenterology

## 2024-05-05 ENCOUNTER — Encounter: Payer: Self-pay | Admitting: Gastroenterology

## 2024-05-27 ENCOUNTER — Encounter: Admitting: Gastroenterology

## 2024-05-27 ENCOUNTER — Telehealth: Payer: Self-pay | Admitting: Gastroenterology

## 2024-05-27 NOTE — Telephone Encounter (Signed)
 Called patient this morning as he was late for procedure.  He states he called yesterday to cancel the procedure because of a conflict with his care partner.  He will call at a later date to reschedule.  Patient has [employer] for his insurance.

## 2024-05-27 NOTE — Telephone Encounter (Signed)
Okay thank you for letting me know, I appreciate it

## 2024-07-22 ENCOUNTER — Other Ambulatory Visit: Payer: Self-pay

## 2024-07-22 MED ORDER — OMEPRAZOLE 40 MG PO CPDR: 40.0000 mg | DELAYED_RELEASE_CAPSULE | Freq: Two times a day (BID) | ORAL | 1 refills | Status: AC
